# Patient Record
Sex: Female | Born: 1940
Health system: Southern US, Community
[De-identification: ages and names within clinical notes are randomized; demographics above are authoritative.]

## PROBLEM LIST (undated history)

## (undated) DIAGNOSIS — G600 Hereditary motor and sensory neuropathy: Secondary | ICD-10-CM

## (undated) DIAGNOSIS — L439 Lichen planus, unspecified: Secondary | ICD-10-CM

## (undated) DIAGNOSIS — D3140 Benign neoplasm of unspecified ciliary body: Secondary | ICD-10-CM

## (undated) DIAGNOSIS — I10 Essential (primary) hypertension: Secondary | ICD-10-CM

## (undated) HISTORY — PX: LEG SURGERY: SHX1003

## (undated) HISTORY — PX: ABDOMINAL HYSTERECTOMY: SHX81

## (undated) HISTORY — DX: Lichen planus, unspecified: L43.9

## (undated) HISTORY — DX: Hereditary motor and sensory neuropathy: G60.0

## (undated) HISTORY — PX: CHOLECYSTECTOMY: SHX55

## (undated) HISTORY — PX: CATARACT EXTRACTION: SUR2

## (undated) HISTORY — DX: Benign neoplasm of unspecified ciliary body: D31.40

## (undated) HISTORY — PX: APPENDECTOMY: SHX54

---

## 1997-07-30 ENCOUNTER — Ambulatory Visit (HOSPITAL_COMMUNITY): Admission: RE | Admit: 1997-07-30 | Discharge: 1997-07-30 | Payer: Self-pay | Admitting: Specialist

## 2011-05-16 ENCOUNTER — Emergency Department (HOSPITAL_BASED_OUTPATIENT_CLINIC_OR_DEPARTMENT_OTHER)
Admission: EM | Admit: 2011-05-16 | Discharge: 2011-05-17 | Disposition: A | Discharge status: Home / Self Care | Payer: Medicare Other | Attending: Emergency Medicine | Admitting: Emergency Medicine

## 2011-05-16 ENCOUNTER — Other Ambulatory Visit: Payer: Self-pay

## 2011-05-16 ENCOUNTER — Encounter (HOSPITAL_BASED_OUTPATIENT_CLINIC_OR_DEPARTMENT_OTHER): Payer: Self-pay | Admitting: *Deleted

## 2011-05-16 DIAGNOSIS — R1013 Epigastric pain: Secondary | ICD-10-CM | POA: Insufficient documentation

## 2011-05-16 DIAGNOSIS — N390 Urinary tract infection, site not specified: Secondary | ICD-10-CM | POA: Insufficient documentation

## 2011-05-16 DIAGNOSIS — I10 Essential (primary) hypertension: Secondary | ICD-10-CM | POA: Insufficient documentation

## 2011-05-16 HISTORY — DX: Essential (primary) hypertension: I10

## 2011-05-16 NOTE — ED Notes (Signed)
Pt states she noticed she was really tired this week and began to take her BP. Noticed it was high and became concerned. Called EMS and they came out to check her. BP 170/90. Denies s/s other than some mild neck pain tonight when pressure was "195/105" Denies pain at present.

## 2011-05-17 ENCOUNTER — Emergency Department (INDEPENDENT_AMBULATORY_CARE_PROVIDER_SITE_OTHER): Payer: Medicare Other

## 2011-05-17 DIAGNOSIS — I10 Essential (primary) hypertension: Secondary | ICD-10-CM

## 2011-05-17 DIAGNOSIS — M542 Cervicalgia: Secondary | ICD-10-CM

## 2011-05-17 DIAGNOSIS — R5383 Other fatigue: Secondary | ICD-10-CM

## 2011-05-17 DIAGNOSIS — R5381 Other malaise: Secondary | ICD-10-CM

## 2011-05-17 DIAGNOSIS — M898X9 Other specified disorders of bone, unspecified site: Secondary | ICD-10-CM

## 2011-05-17 LAB — COMPREHENSIVE METABOLIC PANEL
ALT: 22 U/L (ref 0–35)
AST: 23 U/L (ref 0–37)
Albumin: 4.3 g/dL (ref 3.5–5.2)
Alkaline Phosphatase: 70 U/L (ref 39–117)
Chloride: 104 mEq/L (ref 96–112)
Potassium: 4.5 mEq/L (ref 3.5–5.1)
Sodium: 142 mEq/L (ref 135–145)
Total Protein: 7 g/dL (ref 6.0–8.3)

## 2011-05-17 LAB — URINALYSIS, ROUTINE W REFLEX MICROSCOPIC
Bilirubin Urine: NEGATIVE
Glucose, UA: NEGATIVE mg/dL
Ketones, ur: NEGATIVE mg/dL
pH: 7 (ref 5.0–8.0)

## 2011-05-17 LAB — CBC
MCH: 31.5 pg (ref 26.0–34.0)
MCHC: 33.8 g/dL (ref 30.0–36.0)
Platelets: 206 10*3/uL (ref 150–400)
RBC: 4.26 MIL/uL (ref 3.87–5.11)

## 2011-05-17 LAB — DIFFERENTIAL
Basophils Relative: 0 % (ref 0–1)
Eosinophils Absolute: 0.2 10*3/uL (ref 0.0–0.7)
Neutro Abs: 4.6 10*3/uL (ref 1.7–7.7)
Neutrophils Relative %: 57 % (ref 43–77)

## 2011-05-17 LAB — CARDIAC PANEL(CRET KIN+CKTOT+MB+TROPI)
Relative Index: 4 — ABNORMAL HIGH (ref 0.0–2.5)
Troponin I: <0.3 ng/mL (ref <0.30)

## 2011-05-17 LAB — URINE MICROSCOPIC-ADD ON

## 2011-05-17 MED ORDER — CIPROFLOXACIN HCL 500 MG PO TABS: 500.0000 mg | ORAL_TABLET | Freq: Two times a day (BID) | ORAL | Status: AC

## 2011-05-17 MED ORDER — CIPROFLOXACIN HCL 500 MG PO TABS
500.0000 mg | ORAL_TABLET | Freq: Once | ORAL | Status: AC
  Administered 2011-05-17: 500 mg via ORAL
  Filled 2011-05-17: qty 1

## 2011-05-17 NOTE — ED Provider Notes (Addendum)
History     CSN: [id number]  Arrival date & time 05/16/11  2318   First MD Initiated Contact with Patient 05/16/11 2339      Chief Complaint  Patient presents with  . Hypertension    (Consider location/radiation/quality/duration/timing/severity/associated sxs/prior treatment) Patient is a 71 y.o. female presenting with hypertension. The history is provided by the patient.  Hypertension This is a chronic problem. The current episode started more than 1 week ago. The problem occurs constantly. The problem has been gradually worsening. Associated symptoms include abdominal pain. Pertinent negatives include no chest pain, no headaches and no shortness of breath. Associated symptoms comments: Unclear how long her stomach has been cramping but states she noticed at the most this evening. She denies any diarrhea. She has been checking her blood pressure every day this week and it has been elevated the highest was tonight at 195/105. Everyday this week she is taken 10 mg of lisinopril without improvement in the blood pressure. She states she's been sick he this week and this evening from 8-10 she had left-sided neck taking that resolve spontaneously. The symptoms are aggravated by nothing. The symptoms are relieved by nothing. She has tried nothing for the symptoms. The treatment provided no relief.    Past Medical History  Diagnosis Date  . Hypertension     Past Surgical History  Procedure Date  . Leg surgery   . Abdominal hysterectomy   . Cholecystectomy   . Cataract extraction   . Appendectomy     History reviewed. No pertinent family history.  History  Substance Use Topics  . Smoking status: Never Smoker   . Smokeless tobacco: Not on file  . Alcohol Use: No    OB History    Grav Para Term Preterm Abortions TAB SAB Ect Mult Living                  Review of Systems  Constitutional: Positive for fatigue. Negative for fever, chills and diaphoresis.  HENT: Positive for neck  pain.   Eyes: Negative for visual disturbance.  Respiratory: Negative for shortness of breath.   Cardiovascular: Negative for chest pain.  Gastrointestinal: Positive for abdominal pain. Negative for nausea, vomiting and diarrhea.        Abdominal cramping  Genitourinary: Negative for dysuria.  Musculoskeletal: Negative for back pain.  Neurological: Negative for dizziness, speech difficulty, weakness, numbness and headaches.  All other systems reviewed and are negative.    Allergies  Penicillins and Sulfa antibiotics  Home Medications   Current Outpatient Rx  Name Route Sig Dispense Refill  . DOXYCYCLINE HYCLATE 100 MG PO CAPS Oral Take 100 mg by mouth 2 (two) times daily.    Marland Kitchen LISINOPRIL 10 MG PO TABS Oral Take 10 mg by mouth daily.    . OXYBUTYNIN CHLORIDE 10 % TD GEL Transdermal Place onto the skin.    Marland Kitchen RISEDRONATE SODIUM 35 MG PO TBEC Oral Take by mouth.      BP 148/64  Pulse 70  Temp(Src) 97.7 F (36.5 C) (Oral)  Resp 18  Ht 5\' 3"  (1.6 m)  Wt 122 lb (55.339 kg)  BMI 21.61 kg/m2  SpO2 97%  Physical Exam  Nursing note and vitals reviewed. Constitutional: She is oriented to person, place, and time. She appears well-developed and well-nourished. No distress.  HENT:  Head: Normocephalic and atraumatic.  Mouth/Throat: Oropharynx is clear and moist.  Eyes: EOM are normal. Pupils are equal, round, and reactive to light.  Neck: Normal  range of motion. Neck supple. No JVD present. Carotid bruit is not present.  Cardiovascular: Normal rate, regular rhythm, normal heart sounds and intact distal pulses.  Exam reveals no friction rub.   No murmur heard. Pulmonary/Chest: Effort normal and breath sounds normal. She has no wheezes. She has no rales.  Abdominal: Soft. Bowel sounds are normal. She exhibits no distension. There is no tenderness. There is no rebound and no guarding.  Musculoskeletal: Normal range of motion. She exhibits no tenderness.       No edema  Neurological:  She is alert and oriented to person, place, and time. No cranial nerve deficit.  Skin: Skin is warm and dry. No rash noted.  Psychiatric: She has a normal mood and affect. Her behavior is normal.    ED Course  Procedures (including critical care time)  Labs Reviewed  COMPREHENSIVE METABOLIC PANEL - Abnormal; Notable for the following:    Glucose, Bld 101 (*)    Creatinine, Ser 0.30 (*)    Total Bilirubin 0.2 (*)    All other components within normal limits  URINALYSIS, ROUTINE W REFLEX MICROSCOPIC - Abnormal; Notable for the following:    APPearance CLOUDY (*)    Hgb urine dipstick MODERATE (*)    Leukocytes, UA MODERATE (*)    All other components within normal limits  CARDIAC PANEL(CRET KIN+CKTOT+MB+TROPI) - Abnormal; Notable for the following:    CK, MB 4.7 (*)    Relative Index 4.0 (*)    All other components within normal limits  URINE MICROSCOPIC-ADD ON - Abnormal; Notable for the following:    Bacteria, UA FEW (*)    Casts HYALINE CASTS (*)    All other components within normal limits  CBC  DIFFERENTIAL   Dg Chest 2 View  05/17/2011  *RADIOLOGY REPORT*  Clinical Data: Fatigue.  Hypertension.  Neck pain.  CHEST - 2 VIEW  Comparison: None.  Findings: Cardiac and mediastinal contours appear normal.  The lungs appear clear.  No pleural effusion is identified.  Mild spurring of the left proximal humeral head noted.  IMPRESSION:  1.  No acute thoracic findings. 2.  Mild spurring of the left proximal humerus.  Original Report Authenticated By: Dellia Cloud, M.D.     Date: 05/17/2011  Rate: 68  Rhythm: normal sinus rhythm  QRS Axis: normal  Intervals: normal  ST/T Wave abnormalities: normal  Conduction Disutrbances:none  Narrative Interpretation:   Old EKG Reviewed: none available   1. UTI (lower urinary tract infection)       MDM   Patient states she has not been feeling well all week with generalized fatigue and hypertension. She states this evening her  blood pressure was elevated at 195/105 and had some mild left-sided neck pain that did not radiate, no shortness of breath, no chest pain. The pain lasted for 2 hours and resolve spontaneously. This occurred 4 hours ago. Patient denies other symptoms but on exam does have some mild epigastric pain. Unlikely to be cardiac in origin they sound patient's symptoms however we'll check cardiac enzymes, CBC, CMP, UA to rule out infection or cardiac cause for her symptoms. EKG within normal limits. Chest x-ray pending.  2:11 AM All tests normal except for UA which shows a urinary tract infection. Results were discussed with patient. She'll start Cipro do to penicillin sulfa allergy. Urine culture sent. She'll followup with her doctor Monday as planned.      Gwyneth Sprout, MD 05/17/11 4098  Gwyneth Sprout, MD 05/17/11 [phone]

## 2011-05-17 NOTE — ED Notes (Signed)
MD at bedside. 

## 2011-05-17 NOTE — ED Notes (Addendum)
Requested a copy of visit today(medical records) to be taken to an appointment this week. The copy given to patient by secretary.

## 2011-05-17 NOTE — Discharge Instructions (Signed)
Urine Culture Collection, Female  You will collect a sample of pee (urine) in a cup. Read the instructions below before beginning. If you have any questions, ask the nurse before you begin. Follow the instructions carefully. 1. Wash your hands with soap and water and dry them thoroughly.  2. Open the lid of the cup. Be careful not to touch the inside.  3. Clean the private (genital) area. 1. Sit over the toilet. Use the fingers of one hand to separate and hold open the folds of the skin in your private area.  2. Clean the pee (urinary) opening and surrounding area with the gauze, wiping from front to back. Throw away the gauze in the trash, not the toilet.  3. Repeat step "b"2 more times.  4. With the folds of skin still separated, pee a small amount into toilet. STOP, then pee into the cup. Fill the cup half way.  5. Put the lid on the cup tightly.  6. Wash your hands with soap and water.  7. If you were given a label, put the label on the cup.  8. Give the cup to the nurse.  Document Released: 01/09/2008 Document Revised: 01/15/2011 Document Reviewed: 01/09/2008 [employer] Patient Information 2012 [employer].Urinary Tract Infection Infections of the urinary tract can start in several places. A bladder infection (cystitis), a kidney infection (pyelonephritis), and a prostate infection (prostatitis) are different types of urinary tract infections (UTIs). They usually get better if treated with medicines (antibiotics) that kill germs. Take all the medicine until it is gone. You or your child may feel better in a few days, but TAKE ALL MEDICINE or the infection may not respond and may become more difficult to treat. HOME CARE INSTRUCTIONS   Drink enough water and fluids to keep the urine clear or pale yellow. Cranberry juice is especially recommended, in addition to large amounts of water.   Avoid caffeine, tea, and carbonated beverages. They tend to irritate the bladder.   Alcohol may  irritate the prostate.   Only take over-the-counter or prescription medicines for pain, discomfort, or fever as directed by your caregiver.  To prevent further infections:  Empty the bladder often. Avoid holding urine for long periods of time.   After a bowel movement, women should cleanse from front to back. Use each tissue only once.   Empty the bladder before and after sexual intercourse.  FINDING OUT THE RESULTS OF YOUR TEST Not all test results are available during your visit. If your or your child's test results are not back during the visit, make an appointment with your caregiver to find out the results. Do not assume everything is normal if you have not heard from your caregiver or the medical facility. It is important for you to follow up on all test results. SEEK MEDICAL CARE IF:   There is back pain.   Your baby is older than 3 months with a rectal temperature of 100.5 F (38.1 C) or higher for more than 1 day.   Your or your child's problems (symptoms) are no better in 3 days. Return sooner if you or your child is getting worse.  SEEK IMMEDIATE MEDICAL CARE IF:   There is severe back pain or lower abdominal pain.   You or your child develops chills.   You have a fever.   Your baby is older than 3 months with a rectal temperature of 102 F (38.9 C) or higher.   Your baby is 3 months old or   younger with a rectal temperature of 100.4 F (38 C) or higher.   There is nausea or vomiting.   There is continued burning or discomfort with urination.  MAKE SURE YOU:   Understand these instructions.   Will watch your condition.   Will get help right away if you are not doing well or get worse.  Document Released: 11/05/2004 Document Revised: 01/15/2011 Document Reviewed: 06/10/2006 [employer] Patient Information 2012 [employer]. 

## 2011-05-17 NOTE — ED Notes (Signed)
rx x 1 given for cipro- d/c home with a ride

## 2011-05-18 LAB — URINE CULTURE: Colony Count: 3000

## 2014-04-22 ENCOUNTER — Emergency Department (HOSPITAL_BASED_OUTPATIENT_CLINIC_OR_DEPARTMENT_OTHER): Payer: Medicare Other

## 2014-04-22 ENCOUNTER — Encounter (HOSPITAL_BASED_OUTPATIENT_CLINIC_OR_DEPARTMENT_OTHER): Payer: Self-pay | Admitting: *Deleted

## 2014-04-22 ENCOUNTER — Emergency Department (HOSPITAL_BASED_OUTPATIENT_CLINIC_OR_DEPARTMENT_OTHER)
Admission: EM | Admit: 2014-04-22 | Discharge: 2014-04-22 | Disposition: A | Discharge status: Home / Self Care | Payer: Medicare Other | Attending: Emergency Medicine | Admitting: Emergency Medicine

## 2014-04-22 DIAGNOSIS — Z880 Allergy status to penicillin: Secondary | ICD-10-CM | POA: Diagnosis not present

## 2014-04-22 DIAGNOSIS — Y92010 Kitchen of single-family (private) house as the place of occurrence of the external cause: Secondary | ICD-10-CM | POA: Diagnosis not present

## 2014-04-22 DIAGNOSIS — S8992XA Unspecified injury of left lower leg, initial encounter: Secondary | ICD-10-CM | POA: Diagnosis present

## 2014-04-22 DIAGNOSIS — Z792 Long term (current) use of antibiotics: Secondary | ICD-10-CM | POA: Diagnosis not present

## 2014-04-22 DIAGNOSIS — Y9389 Activity, other specified: Secondary | ICD-10-CM | POA: Diagnosis not present

## 2014-04-22 DIAGNOSIS — I10 Essential (primary) hypertension: Secondary | ICD-10-CM | POA: Insufficient documentation

## 2014-04-22 DIAGNOSIS — W1839XA Other fall on same level, initial encounter: Secondary | ICD-10-CM | POA: Diagnosis not present

## 2014-04-22 DIAGNOSIS — Y998 Other external cause status: Secondary | ICD-10-CM | POA: Insufficient documentation

## 2014-04-22 DIAGNOSIS — S8002XA Contusion of left knee, initial encounter: Secondary | ICD-10-CM | POA: Diagnosis not present

## 2014-04-22 DIAGNOSIS — W19XXXA Unspecified fall, initial encounter: Secondary | ICD-10-CM

## 2014-04-22 DIAGNOSIS — M25462 Effusion, left knee: Secondary | ICD-10-CM

## 2014-04-22 MED ORDER — HYDROCODONE-ACETAMINOPHEN 5-325 MG PO TABS: 1.0000 | ORAL_TABLET | ORAL | Status: DC | PRN

## 2014-04-22 MED ORDER — HYDROCODONE-ACETAMINOPHEN 5-325 MG PO TABS
1.0000 | ORAL_TABLET | Freq: Once | ORAL | Status: AC
  Administered 2014-04-22: 1 via ORAL
  Filled 2014-04-22: qty 1

## 2014-04-22 NOTE — ED Notes (Signed)
Fell last night onto both knees. Now c/o swelling and pain in both, worse in left knee.

## 2014-04-22 NOTE — ED Provider Notes (Signed)
CSN: [id number]     Arrival date & time 04/22/14  1311 History   First MD Initiated Contact with Patient 04/22/14 1357     Chief Complaint  Patient presents with  . Knee Pain  . Fall      HPI  Patient presents for evaluation of knee pain. States she caught the tip of her foot in her kitchen and fell 4 to onto both knees. Had some discomfort. She was able to get up. She had some pain in both knees. Today her left knee is swollen and more painful and she presents here. Still able ambulate, although walking with a limp. No twisting. Does not take blood thinners.  Past Medical History  Diagnosis Date  . Hypertension    Past Surgical History  Procedure Laterality Date  . Leg surgery    . Abdominal hysterectomy    . Cholecystectomy    . Cataract extraction    . Appendectomy     No family history on file. History  Substance Use Topics  . Smoking status: Never Smoker   . Smokeless tobacco: Not on file  . Alcohol Use: No   OB History    No data available     Review of Systems  Constitutional: Negative for fever, chills, diaphoresis, appetite change and fatigue.  HENT: Negative for mouth sores, sore throat and trouble swallowing.   Eyes: Negative for visual disturbance.  Respiratory: Negative for cough, chest tightness, shortness of breath and wheezing.   Cardiovascular: Negative for chest pain.  Gastrointestinal: Negative for nausea, vomiting, abdominal pain, diarrhea and abdominal distention.  Endocrine: Negative for polydipsia, polyphagia and polyuria.  Genitourinary: Negative for dysuria, frequency and hematuria.  Musculoskeletal: Positive for joint swelling and arthralgias. Negative for gait problem.       Pain and swelling to the left knee. Denies pain to the right knee today.  Skin: Negative for color change, pallor and rash.  Neurological: Negative for dizziness, syncope, light-headedness and headaches.  Hematological: Does not bruise/bleed easily.    Psychiatric/Behavioral: Negative for behavioral problems and confusion.      Allergies  Penicillins and Sulfa antibiotics  Home Medications   Prior to Admission medications   Medication Sig Start Date End Date Taking? Authorizing Provider  denosumab (PROLIA) 60 MG/ML SOLN injection Inject 60 mg into the skin every 6 (six) months. Administer in upper arm, thigh, or abdomen   Yes Historical Provider, MD  mirabegron ER (MYRBETRIQ) 25 MG TB24 tablet Take 25 mg by mouth daily.   Yes Historical Provider, MD  omeprazole (PRILOSEC) 20 MG capsule Take 20 mg by mouth daily.   Yes Historical Provider, MD  doxycycline (VIBRAMYCIN) 100 MG capsule Take 100 mg by mouth 2 (two) times daily.    Historical Provider, MD  HYDROcodone-acetaminophen (NORCO/VICODIN) 5-325 MG per tablet Take 1 tablet by mouth every 4 (four) hours as needed. 04/22/14   Tanna Furry, MD  lisinopril (PRINIVIL,ZESTRIL) 10 MG tablet Take 10 mg by mouth daily.    Historical Provider, MD  Oxybutynin Chloride (GELNIQUE) 10 % GEL Place onto the skin.    Historical Provider, MD  Risedronate Sodium (ATELVIA) 35 MG TBEC Take by mouth.    Historical Provider, MD   BP 151/58 mmHg  Pulse 67  Temp(Src) 98.2 F (36.8 C) (Oral)  Resp 18  Ht 5\' 3"  (1.6 m)  Wt 114 lb (51.71 kg)  BMI 20.20 kg/m2  SpO2 100% Physical Exam  Constitutional: She is oriented to person, place, and time. She  appears well-developed and well-nourished. No distress.  HENT:  Head: Normocephalic.  Eyes: Conjunctivae are normal. Pupils are equal, round, and reactive to light. No scleral icterus.  Neck: Normal range of motion. Neck supple. No thyromegaly present.  Cardiovascular: Normal rate and regular rhythm.  Exam reveals no gallop and no friction rub.   No murmur heard. Pulmonary/Chest: Effort normal and breath sounds normal. No respiratory distress. She has no wheezes. She has no rales.  Abdominal: Soft. Bowel sounds are normal. She exhibits no distension. There is  no tenderness. There is no rebound.  Musculoskeletal: Normal range of motion.       Legs: Neurological: She is alert and oriented to person, place, and time.  Skin: Skin is warm and dry. No rash noted.  Psychiatric: She has a normal mood and affect. Her behavior is normal.    ED Course  Procedures (including critical care time) Labs Review Labs Reviewed - No data to display  Imaging Review Dg Knee Complete 4 Views Left  04/22/2014   CLINICAL DATA:  Fall.  EXAM: LEFT KNEE - COMPLETE 4+ VIEW  COMPARISON:  None.  FINDINGS: Large suprapatellar joint effusion. Medial compartment narrowing, sharpening of the tibial spines and marginal spur formation noted. There is no evidence of fracture, dislocation, or joint effusion. Old healed fracture involving the proximal fibula noted. There is no evidence of arthropathy or other focal bone abnormality. Soft tissues are unremarkable.  IMPRESSION: 1. Joint effusion. 2. Mild osteoarthritis.   Electronically Signed   By: Kerby Moors M.D.   On: 04/22/2014 14:26     EKG Interpretation None      MDM   Final diagnoses:  Fall  Knee contusion, left, initial encounter  Knee effusion, left    Exam and x-ray show effusion. X-ray show no fracture. Difficult to perform ligament testing as patient is quite uncomfortable. Not a tense effusion. She does exhibit range of motion. With the mechanism of falling onto a flexed knee and it is unlikely she is sustained ligament injury. Plan is home, intermittent ice, minimize weightbearing, Ace wrap applied here. Vicodin for pain. Primary care, or her orthopedist in 7-10 days with any continued pain or swelling.    Tanna Furry, MD 04/22/14 1447

## 2014-04-22 NOTE — Discharge Instructions (Signed)
Minimize your weightbearing on the leg until the pain, and swelling are improving. Ace wrap on the leg until his pain and swelling are improving. Ice for 20 minutes at a time, 3-4 times per day. Vicodin for pain. Recheck with your primary care physician, or orthopedists in 7-10 days of pain and swelling are not resolved  Contusion A contusion is a deep bruise. Contusions are the result of an injury that caused bleeding under the skin. The contusion may turn blue, purple, or yellow. Minor injuries will give you a painless contusion, but more severe contusions may stay painful and swollen for a few weeks.  CAUSES  A contusion is usually caused by a blow, trauma, or direct force to an area of the body. SYMPTOMS   Swelling and redness of the injured area.  Bruising of the injured area.  Tenderness and soreness of the injured area.  Pain. DIAGNOSIS  The diagnosis can be made by taking a history and physical exam. An X-ray, CT scan, or MRI may be needed to determine if there were any associated injuries, such as fractures. TREATMENT  Specific treatment will depend on what area of the body was injured. In general, the best treatment for a contusion is resting, icing, elevating, and applying cold compresses to the injured area. Over-the-counter medicines may also be recommended for pain control. Ask your caregiver what the best treatment is for your contusion. HOME CARE INSTRUCTIONS   Put ice on the injured area.  Put ice in a plastic bag.  Place a towel between your skin and the bag.  Leave the ice on for 15-20 minutes, 3-4 times a day, or as directed by your health care provider.  Only take over-the-counter or prescription medicines for pain, discomfort, or fever as directed by your caregiver. Your caregiver may recommend avoiding anti-inflammatory medicines (aspirin, ibuprofen, and naproxen) for 48 hours because these medicines may increase bruising.  Rest the injured area.  If  possible, elevate the injured area to reduce swelling. SEEK IMMEDIATE MEDICAL CARE IF:   You have increased bruising or swelling.  You have pain that is getting worse.  Your swelling or pain is not relieved with medicines. MAKE SURE YOU:   Understand these instructions.  Will watch your condition.  Will get help right away if you are not doing well or get worse. Document Released: 11/05/2004 Document Revised: 01/31/2013 Document Reviewed: 12/01/2010 [facility] Patient Information 2015 [place], [place]. This information is not intended to replace advice given to you by your health care provider. Make sure you discuss any questions you have with your health care provider.  Knee Effusion The medical term for having fluid in your knee is effusion. This is often due to an internal derangement of the knee. This means something is wrong inside the knee. Some of the causes of fluid in the knee may be torn cartilage, a torn ligament, or bleeding into the joint from an injury. Your knee is likely more difficult to bend and move. This is often because there is increased pain and pressure in the joint. The time it takes for recovery from a knee effusion depends on different factors, including:   Type of injury.  Your age.  Physical and medical conditions.  Rehabilitation Strategies. How long you will be away from your normal activities will depend on what kind of knee problem you have and how much damage is present. Your knee has two types of cartilage. Articular cartilage covers the bone ends and lets your knee  bend and move smoothly. Two menisci, thick pads of cartilage that form a rim inside the joint, help absorb shock and stabilize your knee. Ligaments bind the bones together and support your knee joint. Muscles move the joint, help support your knee, and take stress off the joint itself. CAUSES  Often an effusion in the knee is caused by an injury to one of the menisci. This is often a tear in  the cartilage. Recovery after a meniscus injury depends on how much meniscus is damaged and whether you have damaged other knee tissue. Small tears may heal on their own with conservative treatment. Conservative means rest, limited weight bearing activity and muscle strengthening exercises. Your recovery may take up to 6 weeks.  TREATMENT  Larger tears may require surgery. Meniscus injuries may be treated during arthroscopy. Arthroscopy is a procedure in which your surgeon uses a small telescope like instrument to look in your knee. Your caregiver can make a more accurate diagnosis (learning what is wrong) by performing an arthroscopic procedure. If your injury is on the inner margin of the meniscus, your surgeon may trim the meniscus back to a smooth rim. In other cases your surgeon will try to repair a damaged meniscus with stitches (sutures). This may make rehabilitation take longer, but may provide better long term result by helping your knee keep its shock absorption capabilities. Ligaments which are completely torn usually require surgery for repair. HOME CARE INSTRUCTIONS  Use crutches as instructed.  If a brace is applied, use as directed.  Once you are home, an ice pack applied to your swollen knee may help with discomfort and help decrease swelling.  Keep your knee raised (elevated) when you are not up and around or on crutches.  Only take over-the-counter or prescription medicines for pain, discomfort, or fever as directed by your caregiver.  Your caregivers will help with instructions for rehabilitation of your knee. This often includes strengthening exercises.  You may resume a normal diet and activities as directed. SEEK MEDICAL CARE IF:   There is increased swelling in your knee.  You notice redness, swelling, or increasing pain in your knee.  An unexplained oral temperature above 102 F (38.9 C) develops. SEEK IMMEDIATE MEDICAL CARE IF:   You develop a rash.  You have  difficulty breathing.  You have any allergic reactions from medications you may have been given.  There is severe pain with any motion of the knee. MAKE SURE YOU:   Understand these instructions.  Will watch your condition.  Will get help right away if you are not doing well or get worse. Document Released: 04/18/2003 Document Revised: 04/20/2011 Document Reviewed: 06/22/2007 [facility] Patient Information 2015 [place], [place]. This information is not intended to replace advice given to you by your health care provider. Make sure you discuss any questions you have with your health care provider.

## 2015-10-17 ENCOUNTER — Other Ambulatory Visit: Payer: Self-pay | Admitting: *Deleted

## 2015-10-17 ENCOUNTER — Encounter: Payer: Self-pay | Admitting: *Deleted

## 2015-10-18 ENCOUNTER — Encounter: Payer: Self-pay | Admitting: Diagnostic Neuroimaging

## 2015-10-18 ENCOUNTER — Ambulatory Visit (INDEPENDENT_AMBULATORY_CARE_PROVIDER_SITE_OTHER): Payer: Medicare HMO | Admitting: Diagnostic Neuroimaging

## 2015-10-18 ENCOUNTER — Telehealth: Payer: Self-pay | Admitting: Diagnostic Neuroimaging

## 2015-10-18 VITALS — BP 140/74 | HR 81 | Ht 63.0 in | Wt 115.2 lb

## 2015-10-18 DIAGNOSIS — G609 Hereditary and idiopathic neuropathy, unspecified: Secondary | ICD-10-CM | POA: Diagnosis not present

## 2015-10-18 NOTE — Telephone Encounter (Signed)
Patient called 9:57:17 to advise, emergency came up this morning and she is running late for 10:30 New Patient appointment w/Dr. Leta Baptist this morning, will be here in approximately 30 minutes. Patient advised, will be seen up to 10 minutes past appointment time, after that will need to reschedule.

## 2015-10-18 NOTE — Patient Instructions (Signed)
  Thank you for coming to see [place] at [facility]. I hope we have been able to provide you high quality care today.  You may receive a patient satisfaction survey over the next few weeks. We would appreciate your feedback and comments so that we may continue to improve ourselves and the health of our patients.    ~~~~~~~~~~~~~~~~~~~~~~~~~~~~~~~~~~~~~~~~~~~~~~~~~~~~~~~~~~~~~~~~~  DR. PENUMALLI'S GUIDE TO HAPPY AND HEALTHY LIVING These are some of my general health and wellness recommendations. Some of them may apply to you better than others. Please use common sense as you try these suggestions and feel free to ask me any questions.   ACTIVITY/FITNESS Mental, social, emotional and physical stimulation are very important for brain and body health. Try learning a new activity (arts, music, language, sports, games).  Keep moving your body to the best of your abilities. You can do this at home, inside or outside, the park, community center, gym or anywhere you like. Consider a physical therapist or personal trainer to get started. Consider the app Sworkit. Fitness trackers such as smart-watches, smart-phones or Fitbits can help as well.   NUTRITION Eat more plants: colorful vegetables, nuts, seeds and berries.  Eat less sugar, salt, preservatives and processed foods.  Avoid toxins such as cigarettes and alcohol.  Drink water when you are thirsty. Warm water with a slice of lemon is an excellent morning drink to start the day.  Consider these websites for more information The Nutrition Source (https://www.henry-hernandez.biz/) Precision Nutrition (WindowBlog.ch)   RELAXATION Consider practicing mindfulness meditation or other relaxation techniques such as deep breathing, prayer, yoga, tai chi, massage. See website mindful.org or the apps Headspace or Calm to help get started.   SLEEP Try to get at least 7-8+ hours sleep per  day. Regular exercise and reduced caffeine will help you sleep better. Practice good sleep hygeine techniques. See website sleep.org for more information.   PLANNING Prepare estate planning, living will, healthcare POA documents. Sometimes this is best planned with the help of an attorney. Theconversationproject.org and agingwithdignity.org are excellent resources.

## 2015-10-18 NOTE — Progress Notes (Signed)
[facility]  PATIENT: Nina Rush DOB: 07/25/1940  REFERRING CLINICIAN: Kathlen Mody HISTORY FROM: patient and husband  REASON FOR VISIT: new consult    HISTORICAL  CHIEF COMPLAINT:  Chief Complaint  Patient presents with  . Other    rm 7, New pt, Charcot-Marie-Tooth disease, husband- Tonye Royalty, "an evaluation of my condition and how it can improve"    HISTORY OF PRESENT ILLNESS:   75 year old right-handed female here for evaluation of Charcot-Marie-Tooth neuropathy. At age 31 months old patient had onset of walking difficulty. Patient continued to have progressive lower extremity numbness and weakness throughout her life. She went through extensive evaluations with neurology, neuromuscular clinics, at [facility], [place] and [facility]. 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 Patient has progressively declined over many years. Now she uses a Rollator walker. She continues to have weakness in her lower extremities greater than upper studies. She has had atrophy in her hands and lower extremity muscles. Patient has high arches and hammertoes. Patient also has numbness and tingling in the bottoms of her feet.  Patient presents today to find out if any new treatments have been discovered regarding treatment of this hereditary neuropathy.  Patient last seen at [facility] neuromuscular clinic in 2012.   REVIEW OF SYSTEMS: Full 14 system review of systems performed and negative with exception of: Only as per history of present illness.  ALLERGIES: Allergies  Allergen Reactions  . Penicillins Rash  . Sulfa Antibiotics Rash    HOME MEDICATIONS: Outpatient Medications Prior to Visit  Medication Sig Dispense Refill  . lisinopril (PRINIVIL,ZESTRIL) 10 MG tablet Take 10 mg by mouth daily.    . mirabegron ER (MYRBETRIQ) 25 MG TB24 tablet Take 25 mg by mouth  daily.    . vitamin C (ASCORBIC ACID) 500 MG tablet 500 mg.    . denosumab (PROLIA) 60 MG/ML SOLN injection Inject 60 mg into the skin every 6 (six) months. Administer in upper arm, thigh, or abdomen    . DHA-EPA-VITAMIN E PO Take by mouth.    . doxycycline (VIBRAMYCIN) 100 MG capsule Take 100 mg by mouth 2 (two) times daily.    Marland Kitchen HYDROcodone-acetaminophen (NORCO/VICODIN) 5-325 MG per tablet Take 1 tablet by mouth every 4 (four) hours as needed. 24 tablet 0  . omeprazole (PRILOSEC) 20 MG capsule Take 20 mg by mouth daily.    . Oxybutynin Chloride (GELNIQUE) 10 % GEL Place onto the skin.    . Risedronate Sodium (ATELVIA) 35 MG TBEC Take by mouth.     No facility-administered medications prior to visit.     PAST MEDICAL HISTORY: Past Medical History:  Diagnosis Date  . Charcot-Marie-Tooth disease   . Fuchs' adenoma    bilateral eyes  . Hypertension   . Lichen planus    scalp    PAST SURGICAL HISTORY: Past Surgical History:  Procedure Laterality Date  . ABDOMINAL HYSTERECTOMY     total  . APPENDECTOMY    . CATARACT EXTRACTION Bilateral   . CHOLECYSTECTOMY    . LEG SURGERY     as child    FAMILY HISTORY: Family History  Problem Relation Age of Onset  . Cancer - Lung Mother   . Stroke Father     SOCIAL HISTORY:  Social History   Social History  . Marital status: Married    Spouse name: Tonye Royalty  . Number of children: 2  . Years of education: 37  Occupational History  . Not on file.   Social History Main Topics  . Smoking status: Never Smoker  . Smokeless tobacco: Never Used  . Alcohol use No  . Drug use: No  . Sexual activity: Not on file   Other Topics Concern  . Not on file   Social History Narrative  . No narrative on file     PHYSICAL EXAM  GENERAL EXAM/CONSTITUTIONAL: Vitals:  Vitals:   10/18/15 1057  BP: 140/74  Pulse: 81  Weight: 115 lb 3.2 oz (52.3 kg)  Height: 5\' 3"  (1.6 m)     Body mass index is 20.41 kg/m.  Visual Acuity  Screening   Right eye Left eye Both eyes  Without correction:     With correction: 20/50 20/50      Patient is in no distress; well developed, nourished and groomed; neck is supple  CARDIOVASCULAR:  Examination of carotid arteries is normal; no carotid bruits  Regular rate and rhythm, no murmurs  Examination of peripheral vascular system by observation and palpation is normal  EYES:  Ophthalmoscopic exam of optic discs and posterior segments is normal; no papilledema or hemorrhages  MUSCULOSKELETAL:  Gait, strength, tone, movements noted in Neurologic exam below  NEUROLOGIC: MENTAL STATUS:  No flowsheet data found.  awake, alert, oriented to person, place and time  recent and remote memory intact  normal attention and concentration  language fluent, comprehension intact, naming intact,   fund of knowledge appropriate  CRANIAL NERVE:   2nd - no papilledema on fundoscopic exam  2nd, 3rd, 4th, 6th - pupils equal and reactive to light, visual fields full to confrontation, extraocular muscles intact, no nystagmus  5th - facial sensation symmetric  7th - facial strength symmetric  8th - hearing intact  9th - palate elevates symmetrically, uvula midline  11th - shoulder shrug symmetric  12th - tongue protrusion midline  MOTOR:   ATROPHY AND WEAKNESS IN HANDS  BILATERAL HF 3; BILATERAL DF 2 (LEFT WORSE THAN RIGHT)  normal bulk and tone, full strength in the BUE, BLE  SENSORY:   normal and symmetric to light touch, temperature, vibration; DECR IN FEET/ANKLES; ABSENT IN RIGHT FOOT  COORDINATION:   finger-nose-finger, fine finger movements normal  REFLEXES:   deep tendon reflexes --> TRACE IN BILATERAL UPPER EXT; 1 AT KNEES; ABSENT AT ANKLES  GAIT/STATION:   narrow based gait; USES A ROLLATOR     DIAGNOSTIC DATA (LABS, IMAGING, TESTING) - I reviewed patient records, labs, notes, testing and imaging myself where available.  Lab Results    Component Value Date   WBC 8.1 05/17/2011   HGB 13.4 05/17/2011   HCT 39.6 05/17/2011   MCV 93.0 05/17/2011   PLT 206 05/17/2011      Component Value Date/Time   NA 142 05/17/2011 0055   K 4.5 05/17/2011 0055   CL 104 05/17/2011 0055   CO2 30 05/17/2011 0055   GLUCOSE 101 (H) 05/17/2011 0055   BUN 10 05/17/2011 0055   CREATININE 0.30 (L) 05/17/2011 0055   CALCIUM 10.1 05/17/2011 0055   PROT 7.0 05/17/2011 0055   ALBUMIN 4.3 05/17/2011 0055   AST 23 05/17/2011 0055   ALT 22 05/17/2011 0055   ALKPHOS 70 05/17/2011 0055   BILITOT 0.2 (L) 05/17/2011 0055   GFRNONAA >90 05/17/2011 0055   GFRAA >90 05/17/2011 0055   No results found for: CHOL, HDL, LDLCALC, LDLDIRECT, TRIG, CHOLHDL No results found for: HGBA1C No results found for: VITAMINB12 No results found  for: TSH   04/13/05 EMG/NCS - Moderate to severe length dependent polyneuropathy     ASSESSMENT AND PLAN  75 y.o. year old female here with lifelong progressive neuropathy syndrome affecting lower greater than upper extremities. Patient has had extensive testing and evaluation in the past at multiple academic neuromuscular centers. I reviewed outside records and test results. I agree with patient's current diagnosis of probable hereditary peripheral neuropathy. 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 We discussed the importance of nutrition, physical activity, use of assisted device as compensatory strategies for this condition. Unfortunately no new breakthrough therapies have been made available at this time.     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 Hereditary and idiopathic peripheral neuropathy     PLAN: - continue supportive care  - stay active with physical activity  Return if symptoms worsen or fail to improve.    Penni Bombard, MD [id number], [id number] AM Certified  in Neurology, Neurophysiology and Neuroimaging  [facility] [address] [place], [place] [address] [phone]

## 2015-10-18 NOTE — Telephone Encounter (Signed)
Noted  

## 2015-12-04 ENCOUNTER — Telehealth: Payer: Self-pay | Admitting: Diagnostic Neuroimaging

## 2015-12-04 DIAGNOSIS — G609 Hereditary and idiopathic neuropathy, unspecified: Secondary | ICD-10-CM

## 2015-12-04 NOTE — Telephone Encounter (Signed)
Pt called request RX for foot or leg brace-UFO she said.  She said she has an old foot brace but she feels she needs a new one.

## 2015-12-05 NOTE — Telephone Encounter (Signed)
Called patient and informed her that a referral to neuro rehab PT has been placed. She should get a call within a few days to schedule an appointment. Patient stated she was told to get her neurologist to order the brace. Advised her that the PT's role is to evaluate her foot and the specific type brace she needs. Advised she call back next Tues if she has not heard about scheduling an appointment. She verbalized understanding, appreciation.

## 2015-12-05 NOTE — Telephone Encounter (Signed)
Ok to place referral from here for PT and foot brace evaluation/replacement. -VRP

## 2015-12-05 NOTE — Addendum Note (Signed)
Addended by: Minna Antis on: 12/05/2015 10:49 AM   Modules accepted: Orders

## 2015-12-05 NOTE — Telephone Encounter (Signed)
PT eval placed for patient as she requested.

## 2016-01-09 ENCOUNTER — Ambulatory Visit: Payer: Medicare HMO | Attending: Diagnostic Neuroimaging | Admitting: Rehabilitation

## 2016-01-09 ENCOUNTER — Encounter: Payer: Self-pay | Admitting: Rehabilitation

## 2016-01-09 DIAGNOSIS — M6281 Muscle weakness (generalized): Secondary | ICD-10-CM

## 2016-01-09 DIAGNOSIS — R2681 Unsteadiness on feet: Secondary | ICD-10-CM | POA: Diagnosis present

## 2016-01-09 DIAGNOSIS — R2689 Other abnormalities of gait and mobility: Secondary | ICD-10-CM | POA: Diagnosis present

## 2016-01-09 DIAGNOSIS — R208 Other disturbances of skin sensation: Secondary | ICD-10-CM

## 2016-01-09 NOTE — Therapy (Signed)
[facility] [address] [place], [place], [address] Phone: [phone]   Fax:  [phone]  Physical Therapy Evaluation  Patient Details  Name: Nina Rush MRN: [id number] Date of Birth: 10-31-1940 Referring Provider: Andrey Spearman  Encounter Date: 01/09/2016      PT End of Session - 01/09/16 1932    Visit Number 1   Number of Visits 11   Date for PT Re-Evaluation 03/09/16   Authorization Type MCR- Gcode on every 10th visit   PT Start Time 1445   PT Stop Time 1533   PT Time Calculation (min) 48 min      Past Medical History:  Diagnosis Date  . Charcot-Marie-Tooth disease   . Fuchs' adenoma    bilateral eyes  . Hypertension   . Lichen planus    scalp    Past Surgical History:  Procedure Laterality Date  . ABDOMINAL HYSTERECTOMY     total  . APPENDECTOMY    . CATARACT EXTRACTION Bilateral   . CHOLECYSTECTOMY    . LEG SURGERY     as child    There were no vitals filed for this visit.       Subjective Assessment - 01/09/16 1450    Subjective "I want an evaluation today because I feel that I'm getting weaker.  I don't know if the braces I have are the right ones.  I'm not falling as much."   Patient is accompained by: Family member  Tonye Royalty (husband)   Limitations House hold activities;Walking   Patient Stated Goals "I want to make sure I have the right kind of support for my feet since I have foot drop."     Currently in Pain? Yes  not today, but does have pain in feet intermittently            [facility] PT Assessment - 01/09/16 0001      Assessment   Medical Diagnosis Charcot marie tooth, peripheral neuropathy   Referring Provider Penumalli, Vikram   Onset Date/Surgical Date --  over several years   Prior Therapy has had PT in the past     Precautions   Precautions Fall   Precaution Comments has B foot up braces     Restrictions   Weight Bearing Restrictions No     Balance Screen    Has the patient fallen in the past 6 months Yes   How many times? 1-2   Has the patient had a decrease in activity level because of a fear of falling?  Yes   Is the patient reluctant to leave their home because of a fear of falling?  Yes     [employer] Private residence   Living Arrangements Spouse/significant other   Available Help at Discharge Family;Available PRN/intermittently   Type of Home House   Home Access Level entry   Home Layout One level  bonus room upstairs (14 stairs, R rail)   Home Equipment Walker - 4 wheels;Grab bars - tub/shower;Grab bars - toilet;Shower seat - built in  walk in shower     Prior Function   Level of Independence Independent with basic ADLs  husband assists with trash   Vocation Retired   Lexicographer, church activities, read, would like to return to horse therapy, swimming     Cognition   Overall Cognitive Status Within Functional Limits for tasks assessed     Sensation   Light Touch Impaired Detail   Light Touch Impaired  Details Impaired RLE;Impaired LLE   Hot/Cold Appears Intact   Proprioception Appears Intact     Coordination   Gross Motor Movements are Fluid and Coordinated Yes   Fine Motor Movements are Fluid and Coordinated No     ROM / Strength   AROM / PROM / Strength Strength     Strength   Overall Strength Deficits   Overall Strength Comments R hip flex 3/5, L hip flex 3+/5, B knee ext 4/5, B knee flex 3/5, B ankle PF 2/5, B ankle DF 2+/5     Transfers   Transfers Sit to Stand;Stand to Sit   Sit to Stand 6: Modified independent (Device/Increase time)   Stand to Sit 6: Modified independent (Device/Increase time)     Ambulation/Gait   Ambulation/Gait Yes   Ambulation/Gait Assistance 5: Supervision;4: Min guard   Ambulation/Gait Assistance Details Pt at mostly S level for gait with rollator, however had single instance of L foot slap causing minor unsteadiness.     Ambulation Distance (Feet)  115 Feet   Assistive device 4-wheeled walker   Gait Pattern Step-through pattern;Decreased stride length;Decreased dorsiflexion - right;Decreased dorsiflexion - left;Lateral hip instability;Trunk flexed   Ambulation Surface Level;Indoor   Gait velocity 1.06 ft/sec   Stairs Yes   Stairs Assistance 4: Min assist   Stairs Assistance Details (indicate cue type and reason) Note heavy reliance on UEs when ascending stairs   Stair Management Technique Two rails;Alternating pattern;Step to pattern;Forwards   Number of Stairs 4   Height of Stairs 6                           PT Education - 01/09/16 1929    Education provided Yes   Education Details Education on evaluation findings-great detail about neuropathy/balance and gait speed, goals, assessment for BLE bracing to increase safety, POC   Person(s) Educated Patient;Spouse   Methods Explanation   Comprehension Verbalized understanding;Returned demonstration          PT Short Term Goals - 01/09/16 1945      PT SHORT TERM GOAL #1   Title Pt will initiate HEP in order to improve BLE strength and functional mobility.  (Target Date: 02/06/16)   Time 4   Period Weeks   Status New     PT SHORT TERM GOAL #2   Title Will assess BERG score and improve score by 4 points in order to indicate decreased fall risk.     Time 4   Period Weeks   Status New     PT SHORT TERM GOAL #3   Title Pt will improve gait speed to 1.66 ft/sec w/ LRAD in order to indicate improved efficiency of gait.     Time 4   Period Weeks   Status New     PT SHORT TERM GOAL #4   Title Will have formal assessment of BLE bracing in order to reduce fall risk.     Time 4   Period Weeks   Status New     PT SHORT TERM GOAL #5   Title Pt will negotiate up/down 14 steps with single rail at mod I level in order to indicate safety when negotiating to bonus room at home.    Time 4   Period Weeks   Status New     Additional Short Term Goals    Additional Short Term Goals Yes     PT SHORT TERM GOAL #6   Title  Will assess 5TSS in order to better assess functional BLE strength.     Time 4   Period Weeks   Status New           PT Long Term Goals - 01/09/16 1950      PT LONG TERM GOAL #1   Title Pt will be independent with HEP in order to indicate improved functional mobility and decreased fall risk.  (Target Date: 03/06/15)   Time 8   Period Weeks   Status New     PT LONG TERM GOAL #2   Title Pt will improve BERG balance test 8 points from baseline in order to indicate decreased fall risk.     Time 8   Period Weeks   Status New     PT LONG TERM GOAL #3   Title Pt will ambulate 500' w/ LRAD and appropriate bracing for BLEs at mod I level in order to indicate safe return to community mobility.     Time 8   Period Weeks   Status New     PT LONG TERM GOAL #4   Title Pt will improve gait speed to 2.26 ft/sec w/ LRAD in order to indicate improved efficiency of gait and decreased fall risk.     Time 8   Period Weeks   Status New     PT LONG TERM GOAL #5   Title Pt will perform 5TSS test <20 seconds without UE support in order to indicate improved functional strength.     Time 8   Period Weeks   Status New     Additional Long Term Goals   Additional Long Term Goals Yes               Plan - 01/09/16 1933    Clinical Impression Statement Pt presents with Charcot [name] Tooth disease with B LE weakness, foot deformity, B foot drop and decreased balance.  Pt with history of falls with 1-2 falls in past 6 months.  Upon PT evaluation, note that gait speed is 1.06 ft/sec, indicative of recurrent fall risk, marked decrease BLE strength, and B foot drop that her current foot up braces are not controlling adequately.  Pt is of evolving presentation and moderate complexity from PT POC standpoint.  Pt will benefit from skilled OP neuro PT to address deficits.     Rehab Potential Good   Clinical Impairments Affecting Rehab  Potential progressive nature of neuropathy   PT Frequency 1x / week  then 2x/wk for 2 weeks   PT Duration 6 weeks  then 2x/wk for 2 weeks   PT Treatment/Interventions ADLs/Self Care Home Management;DME Instruction;Gait training;Stair training;Functional mobility training;Therapeutic activities;Therapeutic exercise;Balance training;Neuromuscular re-education;Orthotic Fit/Training;Patient/family education;Energy conservation;Vestibular   PT Next Visit Plan BERG, 5TSS, HEP for BLE strength, assess varying braces (unsure if our braces will work bc her foot is small), also she may need custom due to foot deformity-PT to have Gerald Stabs present at a PT session for better assessment).     Consulted and Agree with Plan of Care Patient;Family member/caregiver   Family Member Consulted husband      Patient will benefit from skilled therapeutic intervention in order to improve the following deficits and impairments:  Abnormal gait, Decreased activity tolerance, Decreased balance, Decreased knowledge of use of DME, Decreased mobility, Decreased strength, Impaired perceived functional ability, Impaired flexibility, Improper body mechanics, Postural dysfunction, Impaired sensation  Visit Diagnosis: Unsteadiness on feet - Plan: PT plan of care cert/re-cert  Other disturbances of  skin sensation - Plan: PT plan of care cert/re-cert  Muscle weakness (generalized) - Plan: PT plan of care cert/re-cert  Other abnormalities of gait and mobility - Plan: PT plan of care cert/re-cert      G-Codes - [id number]    Functional Assessment Tool Used Gait speed 1.06 ft/sec w/ rollator at min/guard level   Functional Limitation Mobility: Walking and moving around   Mobility: Walking and Moving Around Current Status [id number]) At least 60 percent but less than 80 percent impaired, limited or restricted   Mobility: Walking and Moving Around Goal Status [id number]) At least 1 percent but less than 20 percent impaired, limited or  restricted       Problem List There are no active problems to display for this patient.   Cameron Sprang, PT, MPT [facility] [address] [place], [place], [address] Phone: [phone]   Fax:  [phone] 01/09/16, 7:59 PM  Name: Hartense Delane MRN: [id number] Date of Birth: 07-17-1940

## 2016-01-23 ENCOUNTER — Encounter: Payer: Self-pay | Admitting: Rehabilitation

## 2016-01-23 ENCOUNTER — Ambulatory Visit: Payer: Medicare HMO | Attending: Diagnostic Neuroimaging | Admitting: Rehabilitation

## 2016-01-23 DIAGNOSIS — R2689 Other abnormalities of gait and mobility: Secondary | ICD-10-CM | POA: Insufficient documentation

## 2016-01-23 DIAGNOSIS — M6281 Muscle weakness (generalized): Secondary | ICD-10-CM

## 2016-01-23 DIAGNOSIS — R208 Other disturbances of skin sensation: Secondary | ICD-10-CM | POA: Diagnosis present

## 2016-01-23 DIAGNOSIS — R2681 Unsteadiness on feet: Secondary | ICD-10-CM | POA: Insufficient documentation

## 2016-01-23 NOTE — Patient Instructions (Signed)
Functional Quadriceps: Sit to Stand    Sit on edge of chair, feet flat on floor. Stand upright, extending knees fully.  Try to do without using your hands!! Repeat __10__ times per set. Do __1__ sets per session. Do __1-2__ sessions per day.  http://orth.exer.us/735   Copyright  VHI. All rights reserved.   Hip Flexion / Knee Extension: Straight-Leg Raise (Eccentric)    Lie on back. Lift leg with knee straight. Slowly lower leg for 3-5 seconds. __5_ reps per set, _2__ sets per day, __5-7_ days per week. Lower like elevator, stopping at each floor. Copyright  VHI. All rights reserved.   Abduction: Clam (Eccentric) - Side-Lying    Lie on side with knees bent. Lift top knee, keeping feet together. Keep trunk steady. Slowly lower for 3-5 seconds. _8-10__ reps per set, _2__ sets per day, __5-7_ days per week.   http://ecce.exer.us/65   Copyright  VHI. All rights reserved.      Seated Hip Flexion  ( Alternating Knee Raises)   Begin sitting with good posture. Activate your core stabilizing muscles to protect lumbar spine. Lift your left leg from the hip, keeping your knee bent,  hold, then return to the starting position. Lift your right leg, hold, then return to the starting position. Repeat for appropriate duration or repetitions.  Do 8-10 reps on each side.

## 2016-01-23 NOTE — Therapy (Signed)
[facility] [address] [place], [place], [address] Phone: [phone]   Fax:  [phone]  Physical Therapy Treatment  Patient Details  Name: Nina Rush MRN: [id number] Date of Birth: September 29, 1940 Referring Provider: Andrey Spearman  Encounter Date: 01/23/2016      PT End of Session - 01/23/16 1953    Visit Number 2   Number of Visits 11   Date for PT Re-Evaluation 03/09/16   Authorization Type MCR- Gcode on every 10th visit   PT Start Time 1531   PT Stop Time 1620   PT Time Calculation (min) 49 min   Activity Tolerance Patient tolerated treatment well   Behavior During Therapy [facility] for tasks assessed/performed      Past Medical History:  Diagnosis Date  . Charcot-Marie-Tooth disease   . Fuchs' adenoma    bilateral eyes  . Hypertension   . Lichen planus    scalp    Past Surgical History:  Procedure Laterality Date  . ABDOMINAL HYSTERECTOMY     total  . APPENDECTOMY    . CATARACT EXTRACTION Bilateral   . CHOLECYSTECTOMY    . LEG SURGERY     as child    There were no vitals filed for this visit.      Subjective Assessment - 01/23/16 1952    Subjective Pt reports no changes since last visit.    Patient is accompained by: Family member   Limitations House hold activities;Walking   Patient Stated Goals "I want to make sure I have the right kind of support for my feet since I have foot drop."     Currently in Pain? No/denies           TE:  Initiated HEP for BLE strength, see pt instruction for details on exercises and reps performed.    Self Care:  Discussion with pt and husband regarding goals for therapy, improving balance, strength and decreasing UE reliance with gait and balance.  Education to pt and husband regarding purpose of foot up brace.    Ortho fit train:  Gerald Stabs from [place] present during session to assess gait and possible bracing needs.  Gerald Stabs able to adjust foot up brace in  current shoe to allow increase pull and allow improved foot clearance during swing phase of gait.  Therefore pt did not need any other bracing at this time.  Pt and spouse verbalized understanding with marked improved noted with gait when foot up braces adjusted.                        PT Education - 01/23/16 1953    Education provided Yes   Education Details education on adjustments of B foot up braces, education on balance systems and compensations for neuropathy.    Person(s) Educated Patient;Spouse   Methods Explanation   Comprehension Verbalized understanding          PT Short Term Goals - 01/09/16 1945      PT SHORT TERM GOAL #1   Title Pt will initiate HEP in order to improve BLE strength and functional mobility.  (Target Date: 02/06/16)   Time 4   Period Weeks   Status New     PT SHORT TERM GOAL #2   Title Will assess BERG score and improve score by 4 points in order to indicate decreased fall risk.     Time 4   Period Weeks   Status New  PT SHORT TERM GOAL #3   Title Pt will improve gait speed to 1.66 ft/sec w/ LRAD in order to indicate improved efficiency of gait.     Time 4   Period Weeks   Status New     PT SHORT TERM GOAL #4   Title Will have formal assessment of BLE bracing in order to reduce fall risk.     Time 4   Period Weeks   Status New     PT SHORT TERM GOAL #5   Title Pt will negotiate up/down 14 steps with single rail at mod I level in order to indicate safety when negotiating to bonus room at home.    Time 4   Period Weeks   Status New     Additional Short Term Goals   Additional Short Term Goals Yes     PT SHORT TERM GOAL #6   Title Will assess 5TSS in order to better assess functional BLE strength.     Time 4   Period Weeks   Status New           PT Long Term Goals - 01/09/16 1950      PT LONG TERM GOAL #1   Title Pt will be independent with HEP in order to indicate improved functional mobility and decreased  fall risk.  (Target Date: 03/06/15)   Time 8   Period Weeks   Status New     PT LONG TERM GOAL #2   Title Pt will improve BERG balance test 8 points from baseline in order to indicate decreased fall risk.     Time 8   Period Weeks   Status New     PT LONG TERM GOAL #3   Title Pt will ambulate 500' w/ LRAD and appropriate bracing for BLEs at mod I level in order to indicate safe return to community mobility.     Time 8   Period Weeks   Status New     PT LONG TERM GOAL #4   Title Pt will improve gait speed to 2.26 ft/sec w/ LRAD in order to indicate improved efficiency of gait and decreased fall risk.     Time 8   Period Weeks   Status New     PT LONG TERM GOAL #5   Title Pt will perform 5TSS test <20 seconds without UE support in order to indicate improved functional strength.     Time 8   Period Weeks   Status New     Additional Long Term Goals   Additional Long Term Goals Yes               Plan - 01/23/16 1954    Clinical Impression Statement Skilled session with Gerald Stabs from [facility] [place] [facility] to adjust foot up brace vs get custom bracing (just had to adjust foot up brace) as well as providing pt with initial HEP for BLE strength.     Rehab Potential Good   Clinical Impairments Affecting Rehab Potential progressive nature of neuropathy   PT Frequency 1x / week  then 2x/wk for 2 weeks   PT Duration 6 weeks  then 2x/wk for 2 weeks   PT Treatment/Interventions ADLs/Self Care Home Management;DME Instruction;Gait training;Stair training;Functional mobility training;Therapeutic activities;Therapeutic exercise;Balance training;Neuromuscular re-education;Orthotic Fit/Training;Patient/family education;Energy conservation;Vestibular   PT Next Visit Plan BERG, add balance to HEP, continue to work on functional strength and balance with decreasing UE support.    Consulted and Agree with Plan of Care Patient;Family member/caregiver  Family Member Consulted husband      Patient will  benefit from skilled therapeutic intervention in order to improve the following deficits and impairments:  Abnormal gait, Decreased activity tolerance, Decreased balance, Decreased knowledge of use of DME, Decreased mobility, Decreased strength, Impaired perceived functional ability, Impaired flexibility, Improper body mechanics, Postural dysfunction, Impaired sensation  Visit Diagnosis: Unsteadiness on feet  Other disturbances of skin sensation  Muscle weakness (generalized)  Other abnormalities of gait and mobility     Problem List There are no active problems to display for this patient.   Denice Bors 01/23/2016, 7:57 PM  [facility] [address] [place] [address] [place], [place], [address] Phone: [phone]   Fax:  [phone]  Name: Nina Rush MRN: [id number] Date of Birth: 06-09-1940

## 2016-01-29 ENCOUNTER — Ambulatory Visit: Payer: Medicare HMO | Admitting: Physical Therapy

## 2016-01-29 ENCOUNTER — Encounter: Payer: Self-pay | Admitting: Physical Therapy

## 2016-01-29 DIAGNOSIS — R2681 Unsteadiness on feet: Secondary | ICD-10-CM | POA: Diagnosis not present

## 2016-01-29 DIAGNOSIS — M6281 Muscle weakness (generalized): Secondary | ICD-10-CM

## 2016-01-29 DIAGNOSIS — R2689 Other abnormalities of gait and mobility: Secondary | ICD-10-CM

## 2016-01-29 NOTE — Therapy (Signed)
[facility] [address] [place], [place], [address] Phone: [phone]   Fax:  [phone]  Physical Therapy Treatment  Patient Details  Name: Nina Rush MRN: [id number] Date of Birth: 11/01/1940 Referring Provider: Andrey Spearman  Encounter Date: 01/29/2016      PT End of Session - 01/29/16 1608    Visit Number 3   Number of Visits 11   Date for PT Re-Evaluation 03/09/16   Authorization Type MCR- Gcode on every 10th visit   PT Start Time 1326  pt arrived late   PT Stop Time 1355  pt wanted to leave early   PT Time Calculation (min) 29 min   Activity Tolerance Patient tolerated treatment well   Behavior During Therapy [facility] for tasks assessed/performed      Past Medical History:  Diagnosis Date  . Charcot-Marie-Tooth disease   . Fuchs' adenoma    bilateral eyes  . Hypertension   . Lichen planus    scalp    Past Surgical History:  Procedure Laterality Date  . ABDOMINAL HYSTERECTOMY     total  . APPENDECTOMY    . CATARACT EXTRACTION Bilateral   . CHOLECYSTECTOMY    . LEG SURGERY     as child    There were no vitals filed for this visit.      Subjective Assessment - 01/29/16 1328    Subjective Pt was able to work on HEP.   Patient is accompained by: Family member   Limitations House hold activities;Walking   Patient Stated Goals "I want to make sure I have the right kind of support for my feet since I have foot drop."     Currently in Pain? No/denies            [facility] PT Assessment - 01/29/16 0001      Standardized Balance Assessment   Standardized Balance Assessment Berg Balance Test     Berg Balance Test   Sit to Stand Needs minimal aid to stand or to stabilize   Standing Unsupported Unable to stand 30 seconds unassisted   Sitting with Back Unsupported but Feet Supported on Floor or Stool Able to sit safely and securely 2 minutes   Stand to Sit Uses backs of legs against chair to  control descent   Transfers Able to transfer with verbal cueing and /or supervision   Standing Unsupported with Eyes Closed Needs help to keep from falling   Standing Ubsupported with Feet Together Needs help to attain position and unable to hold for 15 seconds   From Standing, Reach Forward with Outstretched Arm Loses balance while trying/requires external support   From Standing Position, Pick up Object from Floor Unable to try/needs assist to keep balance   From Standing Position, Turn to Look Behind Over each Shoulder Needs assist to keep from losing balance and falling   Turn 360 Degrees Needs assistance while turning   Standing Unsupported, Alternately Place Feet on Step/Stool Needs assistance to keep from falling or unable to try   Standing Unsupported, One Foot in Front Loses balance while stepping or standing   Standing on One Leg Unable to try or needs assist to prevent fall   Total Score 9                     [facility] Adult PT Treatment/Exercise - 01/29/16 0001      Knee/Hip Exercises: Standing   Knee Flexion Strengthening;Both;1 set;10 reps   Hip Flexion Stengthening;Both;1  set;10 reps   Hip Extension Stengthening;Both;1 set;10 reps;Knee straight     Knee/Hip Exercises: Seated   Ball Squeeze x10,  5 second holds.   Clamshell with TheraBand Yellow  10x each                PT Education - 01/29/16 1354    Education provided Yes   Education Details Discussed results of Berg.   Person(s) Educated Patient   Methods Explanation   Comprehension Verbalized understanding          PT Short Term Goals - 01/09/16 1945      PT SHORT TERM GOAL #1   Title Pt will initiate HEP in order to improve BLE strength and functional mobility.  (Target Date: 02/06/16)   Time 4   Period Weeks   Status New     PT SHORT TERM GOAL #2   Title Will assess BERG score and improve score by 4 points in order to indicate decreased fall risk.     Time 4   Period Weeks    Status New     PT SHORT TERM GOAL #3   Title Pt will improve gait speed to 1.66 ft/sec w/ LRAD in order to indicate improved efficiency of gait.     Time 4   Period Weeks   Status New     PT SHORT TERM GOAL #4   Title Will have formal assessment of BLE bracing in order to reduce fall risk.     Time 4   Period Weeks   Status New     PT SHORT TERM GOAL #5   Title Pt will negotiate up/down 14 steps with single rail at mod I level in order to indicate safety when negotiating to bonus room at home.    Time 4   Period Weeks   Status New     Additional Short Term Goals   Additional Short Term Goals Yes     PT SHORT TERM GOAL #6   Title Will assess 5TSS in order to better assess functional BLE strength.     Time 4   Period Weeks   Status New           PT Long Term Goals - 01/09/16 1950      PT LONG TERM GOAL #1   Title Pt will be independent with HEP in order to indicate improved functional mobility and decreased fall risk.  (Target Date: 03/06/15)   Time 8   Period Weeks   Status New     PT LONG TERM GOAL #2   Title Pt will improve BERG balance test 8 points from baseline in order to indicate decreased fall risk.     Time 8   Period Weeks   Status New     PT LONG TERM GOAL #3   Title Pt will ambulate 500' w/ LRAD and appropriate bracing for BLEs at mod I level in order to indicate safe return to community mobility.     Time 8   Period Weeks   Status New     PT LONG TERM GOAL #4   Title Pt will improve gait speed to 2.26 ft/sec w/ LRAD in order to indicate improved efficiency of gait and decreased fall risk.     Time 8   Period Weeks   Status New     PT LONG TERM GOAL #5   Title Pt will perform 5TSS test <20 seconds without UE support in order to indicate  improved functional strength.     Time 8   Period Weeks   Status New     Additional Long Term Goals   Additional Long Term Goals Yes               Plan - 01/29/16 1609    Clinical Impression  Statement Pt scored 9/56 on Berg.  Progressed LE strengthening with standing exercises; pt was challenged but tolerated well alternating with seated exercises.   Rehab Potential Good   Clinical Impairments Affecting Rehab Potential progressive nature of neuropathy   PT Frequency 1x / week  then 2x/wk for 2 weeks   PT Duration 6 weeks  then 2x/wk for 2 weeks   PT Treatment/Interventions ADLs/Self Care Home Management;DME Instruction;Gait training;Stair training;Functional mobility training;Therapeutic activities;Therapeutic exercise;Balance training;Neuromuscular re-education;Orthotic Fit/Training;Patient/family education;Energy conservation;Vestibular   PT Next Visit Plan add balance to HEP, continue to work on functional strength and balance with decreasing UE support.    Consulted and Agree with Plan of Care Patient;Family member/caregiver   Family Member Consulted husband      Patient will benefit from skilled therapeutic intervention in order to improve the following deficits and impairments:  Abnormal gait, Decreased activity tolerance, Decreased balance, Decreased knowledge of use of DME, Decreased mobility, Decreased strength, Impaired perceived functional ability, Impaired flexibility, Improper body mechanics, Postural dysfunction, Impaired sensation  Visit Diagnosis: Unsteadiness on feet  Muscle weakness (generalized)  Other abnormalities of gait and mobility     Problem List There are no active problems to display for this patient.  Bjorn Loser, PTA  01/29/16, 4:14 PM [facility] [address] [place], [place], [address] Phone: [phone]   Fax:  [phone]  Name: Nina Rush MRN: [id number] Date of Birth: 28-Oct-1940

## 2016-01-29 NOTE — Patient Instructions (Signed)
Functional Quadriceps: Sit to Stand    Sit on edge of chair, feet flat on floor. Stand upright, extending knees fully.  Try to do without using your hands!! Repeat __10__ times per set. Do __1__ sets per session. Do __1-2__ sessions per day.  http://orth.exer.us/735   Copyright  VHI. All rights reserved.   Hip Flexion / Knee Extension: Straight-Leg Raise (Eccentric)    Lie on back. Lift leg with knee straight. Slowly lower leg for 3-5 seconds. __5_ reps per set, _2__ sets per day, __5-7_ days per week. Lower like elevator, stopping at each floor. Copyright  VHI. All rights reserved.   Abduction: Clam (Eccentric) - Side-Lying    Lie on side with knees bent. Lift top knee, keeping feet together. Keep trunk steady. Slowly lower for 3-5 seconds. _8-10__ reps per set, _2__ sets per day, __5-7_ days per week.   http://ecce.exer.us/65   Copyright  VHI. All rights reserved.      Seated Hip Flexion  ( Alternating Knee Raises)   Begin sitting with good posture. Activate your core stabilizing muscles to protect lumbar spine. Lift your left leg from the hip, keeping your knee bent,  hold, then return to the starting position. Lift your right leg, hold, then return to the starting position. Repeat for appropriate duration or repetitions.  Do 8-10 reps on each side.

## 2016-02-02 ENCOUNTER — Emergency Department (HOSPITAL_BASED_OUTPATIENT_CLINIC_OR_DEPARTMENT_OTHER)
Admission: EM | Admit: 2016-02-02 | Discharge: 2016-02-02 | Disposition: A | Discharge status: Home / Self Care | Payer: Medicare HMO | Attending: Emergency Medicine | Admitting: Emergency Medicine

## 2016-02-02 ENCOUNTER — Encounter (HOSPITAL_BASED_OUTPATIENT_CLINIC_OR_DEPARTMENT_OTHER): Payer: Self-pay | Admitting: *Deleted

## 2016-02-02 ENCOUNTER — Emergency Department (HOSPITAL_BASED_OUTPATIENT_CLINIC_OR_DEPARTMENT_OTHER): Payer: Medicare HMO

## 2016-02-02 DIAGNOSIS — I10 Essential (primary) hypertension: Secondary | ICD-10-CM | POA: Insufficient documentation

## 2016-02-02 DIAGNOSIS — Z79899 Other long term (current) drug therapy: Secondary | ICD-10-CM | POA: Insufficient documentation

## 2016-02-02 DIAGNOSIS — R350 Frequency of micturition: Secondary | ICD-10-CM | POA: Diagnosis present

## 2016-02-02 DIAGNOSIS — R5381 Other malaise: Secondary | ICD-10-CM | POA: Diagnosis not present

## 2016-02-02 DIAGNOSIS — R300 Dysuria: Secondary | ICD-10-CM | POA: Diagnosis not present

## 2016-02-02 DIAGNOSIS — R5383 Other fatigue: Secondary | ICD-10-CM | POA: Diagnosis not present

## 2016-02-02 LAB — URINALYSIS, ROUTINE W REFLEX MICROSCOPIC
Bilirubin Urine: NEGATIVE
GLUCOSE, UA: NEGATIVE mg/dL
Ketones, ur: NEGATIVE mg/dL
LEUKOCYTES UA: NEGATIVE
Nitrite: NEGATIVE
PROTEIN: NEGATIVE mg/dL
SPECIFIC GRAVITY, URINE: 1.009 (ref 1.005–1.030)
pH: 7 (ref 5.0–8.0)

## 2016-02-02 LAB — COMPREHENSIVE METABOLIC PANEL
ALBUMIN: 4.5 g/dL (ref 3.5–5.0)
ALK PHOS: 52 U/L (ref 38–126)
ALT: 24 U/L (ref 14–54)
ANION GAP: 7 (ref 5–15)
AST: 28 U/L (ref 15–41)
BILIRUBIN TOTAL: 0.6 mg/dL (ref 0.3–1.2)
BUN: 14 mg/dL (ref 6–20)
CO2: 32 mmol/L (ref 22–32)
Calcium: 9.9 mg/dL (ref 8.9–10.3)
Chloride: 102 mmol/L (ref 101–111)
Creatinine, Ser: <0.3 mg/dL — ABNORMAL LOW (ref 0.44–1.00)
GLUCOSE: 85 mg/dL (ref 65–99)
Potassium: 3.5 mmol/L (ref 3.5–5.1)
Sodium: 141 mmol/L (ref 135–145)
TOTAL PROTEIN: 6.7 g/dL (ref 6.5–8.1)

## 2016-02-02 LAB — CBC WITH DIFFERENTIAL/PLATELET
BASOS PCT: 0 %
Basophils Absolute: 0 10*3/uL (ref 0.0–0.1)
EOS ABS: 0 10*3/uL (ref 0.0–0.7)
EOS PCT: 0 %
HCT: 43.1 % (ref 36.0–46.0)
HEMOGLOBIN: 14.3 g/dL (ref 12.0–15.0)
Lymphocytes Relative: 29 %
Lymphs Abs: 1.5 10*3/uL (ref 0.7–4.0)
MCH: 31.2 pg (ref 26.0–34.0)
MCHC: 33.2 g/dL (ref 30.0–36.0)
MCV: 94.1 fL (ref 78.0–100.0)
MONOS PCT: 9 %
Monocytes Absolute: 0.4 10*3/uL (ref 0.1–1.0)
NEUTROS PCT: 62 %
Neutro Abs: 3.1 10*3/uL (ref 1.7–7.7)
PLATELETS: 218 10*3/uL (ref 150–400)
RBC: 4.58 MIL/uL (ref 3.87–5.11)
RDW: 13.2 % (ref 11.5–15.5)
WBC: 5.1 10*3/uL (ref 4.0–10.5)

## 2016-02-02 LAB — URINALYSIS, MICROSCOPIC (REFLEX): WBC, UA: NONE SEEN WBC/hpf (ref 0–5)

## 2016-02-02 LAB — TROPONIN I: Troponin I: <0.03 ng/mL (ref <0.03)

## 2016-02-02 NOTE — ED Triage Notes (Signed)
Pt reports dysuria, frequency and urgency x 3days. Also reports hematuria but states this is normal for her. Denies fever, n/v/d, back pain.

## 2016-02-02 NOTE — ED Provider Notes (Signed)
[facility] DEPT [facility] Provider Note   CSN: [id number] Arrival date & time: 02/02/16  [id number]     History   Chief Complaint Chief Complaint  Patient presents with  . Urinary Frequency    HPI Nina Rush is a 75 y.o. female.  HPI Patient for several days of severe fatigue and general weakness. Has had urinary frequency for 2 days. Patient reports frequent UTI. She suspects she has urinary tract infection. No fever, no chills, no flank pain or abdominal pain. No other recent changes to patient's medication or general health. Complete review of systems is otherwise negative. Past Medical History:  Diagnosis Date  . Charcot-Marie-Tooth disease   . Fuchs' adenoma    bilateral eyes  . Hypertension   . Lichen planus    scalp    There are no active problems to display for this patient.   Past Surgical History:  Procedure Laterality Date  . ABDOMINAL HYSTERECTOMY     total  . APPENDECTOMY    . CATARACT EXTRACTION Bilateral   . CHOLECYSTECTOMY    . LEG SURGERY     as child    OB History    No data available       Home Medications    Prior to Admission medications   Medication Sig Start Date End Date Taking? Authorizing Provider  clobetasol (TEMOVATE) 0.05 % external solution  04/27/13  Yes Historical Provider, MD  estradiol (ESTRACE) 0.1 MG/GM vaginal cream Apply pea size amount every other day as directed   Yes Historical Provider, MD  furosemide (LASIX) 20 MG tablet 20 mg daily. 10/18/15  Yes Historical Provider, MD  KLOR-CON M10 10 MEQ tablet 10 mEq. 10/18/15  Yes Historical Provider, MD  lisinopril (PRINIVIL,ZESTRIL) 10 MG tablet Take 10 mg by mouth daily.   Yes Historical Provider, MD  vitamin C (ASCORBIC ACID) 500 MG tablet 500 mg.   Yes Historical Provider, MD  ciprofloxacin (CIPRO) 500 MG tablet 500 mg daily. X 1 month 09/06/15   Historical Provider, MD  mirabegron ER (MYRBETRIQ) 25 MG TB24 tablet Take 25 mg by mouth daily.    Historical Provider, MD  solifenacin  (VESICARE) 10 MG tablet Take by mouth daily.    Historical Provider, MD    Family History Family History  Problem Relation Age of Onset  . Cancer - Lung Mother   . Stroke Father     Social History Social History  Substance Use Topics  . Smoking status: Never Smoker  . Smokeless tobacco: Never Used  . Alcohol use No     Allergies   Penicillins and Sulfa antibiotics   Review of Systems Review of Systems 10 Systems reviewed and are negative for acute change except as noted in the HPI.   Physical Exam Updated Vital Signs BP 161/84   Pulse 72   Temp 98.1 F (36.7 C) (Oral)   Resp 14   Ht 5\' 3"  (1.6 m)   Wt 114 lb (51.7 kg)   SpO2 100%   BMI 20.19 kg/m   Physical Exam  Constitutional: She is oriented to person, place, and time. She appears well-developed and well-nourished. No distress.  HENT:  Head: Normocephalic and atraumatic.  Bilateral TMs normal. Posterior oropharynx widely patent and clear without exudate. Dentition excellent condition.  Eyes: Conjunctivae and EOM are normal. Pupils are equal, round, and reactive to light.  Neck: Neck supple. No thyromegaly present.  Cardiovascular: Normal rate and regular rhythm.   No murmur heard. Pulmonary/Chest: Effort normal and breath  sounds normal. No respiratory distress.  Abdominal: Soft. She exhibits no distension and no mass. There is no tenderness. There is no guarding.  Musculoskeletal: She exhibits no edema.  Patient has chronic bony malformation of the feet. She however has no wounds or lesions. Cellulitis. 1+ edema bilateral ankles and feet.  Lymphadenopathy:    She has no cervical adenopathy.  Neurological: She is alert and oriented to person, place, and time. No cranial nerve deficit. She exhibits normal muscle tone. Coordination normal.  Skin: Skin is warm and dry. No rash noted.  Psychiatric: She has a normal mood and affect.  Nursing note and vitals reviewed.    ED Treatments / Results  Labs (all  labs ordered are listed, but only abnormal results are displayed) Labs Reviewed  URINALYSIS, ROUTINE W REFLEX MICROSCOPIC - Abnormal; Notable for the following:       Result Value   APPearance CLOUDY (*)    Hgb urine dipstick TRACE (*)    All other components within normal limits  URINALYSIS, MICROSCOPIC (REFLEX) - Abnormal; Notable for the following:    Bacteria, UA FEW (*)    Squamous Epithelial / LPF 0-5 (*)    All other components within normal limits  COMPREHENSIVE METABOLIC PANEL - Abnormal; Notable for the following:    Creatinine, Ser <0.30 (*)    All other components within normal limits  URINE CULTURE  TROPONIN I  CBC WITH DIFFERENTIAL/PLATELET    EKG  EKG Interpretation  Date/Time:  Sunday February 02 2016 11:19:08 EST Ventricular Rate:  75 PR Interval:    QRS Duration: 96 QT Interval:  391 QTC Calculation: 437 R Axis:   -12 Text Interpretation:  Sinus rhythm Abnormal R-wave progression, early transition Baseline wander in lead(s) I III aVL V3 no ischemic appearance, no sig change from previous Confirmed by Johnney Killian, MD, Jeannie Done [phone]) on 02/02/2016 12:09:24 PM Also confirmed by Johnney Killian, MD, Jeannie Done [phone]), editor [place], Joelene Millin [phone])  on 02/02/2016 12:18:15 PM       Radiology Dg Chest 2 View  Result Date: 02/02/2016 CLINICAL DATA:  Chest pain for 3 days.  Weakness. EXAM: CHEST  2 VIEW COMPARISON:  Plain film 09/23/2015.  CT 11/17/2013. FINDINGS: Left greater than right glenohumeral joint osteoarthritis. Convex right thoracic spine curvature. Midline trachea. Normal heart size and mediastinal contours. No pleural effusion or pneumothorax. Clear lungs. EKG lead artifact projects over the left upper lobe. IMPRESSION: No acute cardiopulmonary disease. Electronically Signed   By: Abigail Miyamoto M.D.   On: 02/02/2016 11:51    Procedures Procedures (including critical care time)  Medications Ordered in ED Medications - No data to display   Initial Impression /  Assessment and Plan / ED Course  I have reviewed the triage vital signs and the nursing notes.  Pertinent labs & imaging results that were available during my care of the patient were reviewed by me and considered in my medical decision making (see chart for details).  Clinical Course      Final Clinical Impressions(s) / ED Diagnoses   Final diagnoses:  Dysuria  Malaise and fatigue  Patient has had significant malaise and fatigue for several days. She suspected UTI. Urinalysis shows no signs of infection. General evaluation does not show other diagnostic results suggestive of  infectious illness or cardiac ischemia. At this time, patient will be given precautionary statement for return if changing or new symptoms. Instructions to follow-up with primary care doctor within the next several days. Urine culture will be pending but  no empiric antibiotics based on normal UA result.  New Prescriptions New Prescriptions   No medications on file     Charlesetta Shanks, MD 02/02/16 1250

## 2016-02-04 LAB — URINE CULTURE: Culture: NO GROWTH

## 2016-02-11 DIAGNOSIS — R319 Hematuria, unspecified: Secondary | ICD-10-CM | POA: Diagnosis not present

## 2016-02-11 DIAGNOSIS — N390 Urinary tract infection, site not specified: Secondary | ICD-10-CM | POA: Diagnosis not present

## 2016-02-11 DIAGNOSIS — R310 Gross hematuria: Secondary | ICD-10-CM | POA: Diagnosis not present

## 2016-02-14 ENCOUNTER — Encounter: Payer: Self-pay | Admitting: Rehabilitation

## 2016-02-14 ENCOUNTER — Ambulatory Visit: Payer: PPO | Attending: Diagnostic Neuroimaging | Admitting: Rehabilitation

## 2016-02-14 DIAGNOSIS — M6281 Muscle weakness (generalized): Secondary | ICD-10-CM

## 2016-02-14 DIAGNOSIS — R208 Other disturbances of skin sensation: Secondary | ICD-10-CM

## 2016-02-14 DIAGNOSIS — R2689 Other abnormalities of gait and mobility: Secondary | ICD-10-CM

## 2016-02-14 DIAGNOSIS — R2681 Unsteadiness on feet: Secondary | ICD-10-CM | POA: Diagnosis not present

## 2016-02-14 NOTE — Therapy (Signed)
[facility] [address] [place], [place], [address] Phone: [phone]   Fax:  [phone]  Physical Therapy Treatment  Patient Details  Name: Nina Rush MRN: [id number] Date of Birth: 1940/08/12 Referring Provider: Andrey Spearman  Encounter Date: 02/14/2016      PT End of Session - 02/14/16 1214    Visit Number 4   Number of Visits 11   Date for PT Re-Evaluation 03/09/16   Authorization Type MCR- Gcode on every 10th visit   PT Start Time 1158   PT Stop Time 1245   PT Time Calculation (min) 47 min   Activity Tolerance Patient tolerated treatment well   Behavior During Therapy [facility] for tasks assessed/performed      Past Medical History:  Diagnosis Date  . Charcot-Marie-Tooth disease   . Fuchs' adenoma    bilateral eyes  . Hypertension   . Lichen planus    scalp    Past Surgical History:  Procedure Laterality Date  . ABDOMINAL HYSTERECTOMY     total  . APPENDECTOMY    . CATARACT EXTRACTION Bilateral   . CHOLECYSTECTOMY    . LEG SURGERY     as child    There were no vitals filed for this visit.      Subjective Assessment - 02/14/16 1204    Subjective Pt reports she is doing exercises sporadically.     Patient is accompained by: Family member   Limitations House hold activities;Walking   Patient Stated Goals "I want to make sure I have the right kind of support for my feet since I have foot drop."     Currently in Pain? No/denies                         [facility] Adult PT Treatment/Exercise - 02/14/16 0001      Transfers   Transfers Sit to Stand;Stand to Sit   Sit to Stand 6: Modified independent (Device/Increase time)   Five time sit to stand comments  15.34 secs with BUE support   Stand to Sit 6: Modified independent (Device/Increase time)   Comments Educated to begin to perform at home with single UE support only as she was able to do so in session.      Ambulation/Gait    Ambulation/Gait Yes   Ambulation/Gait Assistance 5: Supervision;4: Min guard   Ambulation Distance (Feet) 200 Feet   Assistive device 4-wheeled walker   Gait Pattern Step-through pattern;Decreased stride length;Decreased dorsiflexion - right;Decreased dorsiflexion - left;Lateral hip instability;Trunk flexed   Ambulation Surface Level;Indoor   Gait velocity 1.84 ft/sec with rollator   Stairs Yes   Stairs Assistance 5: Supervision   Stairs Assistance Details (indicate cue type and reason) Pt unable to safely negotiate stairs with single rail at this time, but is able to perform at S level x 16 steps with B rails.  encouraged her to begin this at home 2 times daily to increase BLE strength.     Stair Management Technique Two rails;Alternating pattern;Step to pattern;Forwards   Number of Stairs 16   Height of Stairs 6     Exercises   Exercises Other Exercises   Other Exercises  went through curent HEP per pt request to ensure technique.  Performed SL clam x 10 reps, supine SLR with cues for decreased height for improved technqiue x 7 reps, seated marching x 5 reps BLE with cues for posture and scooting to EOC.  PT Education - 02/14/16 1214    Education provided Yes   Education Details Educated on importance of compliance with HEP daily and to perform stairs in home 2x/day.  Education regarding progression of balance and strengthening exercises and conitnue to educate on gait speed.  Pt still with concerns that foot up brace and positioning and furthermore recommended that she may get company that made her shoes to stitch plastic piece to shoe    Person(s) Educated Patient;Spouse   Methods Explanation   Comprehension Verbalized understanding          PT Short Term Goals - 02/14/16 1214      PT SHORT TERM GOAL #1   Title Pt will initiate HEP in order to improve BLE strength and functional mobility.  (Target Date: 02/06/16)   Baseline pt doing intermittently at  home.    Time 4   Period Weeks   Status Partially Met     PT SHORT TERM GOAL #2   Title Will assess BERG score and improve score by 4 points in order to indicate decreased fall risk.     Baseline 9/56 on 01/29/16   Time 4   Period Weeks   Status Achieved     PT SHORT TERM GOAL #3   Title Pt will improve gait speed to 1.66 ft/sec w/ LRAD in order to indicate improved efficiency of gait.     Baseline 1.84 ft/sec with rollator on 02/14/16   Time 4   Period Weeks   Status Achieved     PT SHORT TERM GOAL #4   Title Will have formal assessment of BLE bracing in order to reduce fall risk.     Baseline met    Time 4   Period Weeks   Status Achieved     PT SHORT TERM GOAL #5   Title Pt will negotiate up/down 14 steps with single rail at mod I level in order to indicate safety when negotiating to bonus room at home.    Time 4   Period Weeks   Status Not Met     PT SHORT TERM GOAL #6   Title Will assess 5TSS in order to better assess functional BLE strength.     Baseline 15.34 secs with BUE support on 02/14/16   Time 4   Period Weeks   Status Achieved           PT Long Term Goals - 01/09/16 1950      PT LONG TERM GOAL #1   Title Pt will be independent with HEP in order to indicate improved functional mobility and decreased fall risk.  (Target Date: 03/06/15)   Time 8   Period Weeks   Status New     PT LONG TERM GOAL #2   Title Pt will improve BERG balance test 8 points from baseline in order to indicate decreased fall risk.     Time 8   Period Weeks   Status New     PT LONG TERM GOAL #3   Title Pt will ambulate 500' w/ LRAD and appropriate bracing for BLEs at mod I level in order to indicate safe return to community mobility.     Time 8   Period Weeks   Status New     PT LONG TERM GOAL #4   Title Pt will improve gait speed to 2.26 ft/sec w/ LRAD in order to indicate improved efficiency of gait and decreased fall risk.     Time 8  Period Weeks   Status New     PT  LONG TERM GOAL #5   Title Pt will perform 5TSS test <20 seconds without UE support in order to indicate improved functional strength.     Time 8   Period Weeks   Status New     Additional Long Term Goals   Additional Long Term Goals Yes               Plan - 02/14/16 1214    Clinical Impression Statement Skilled session focused on assessment of STGs (pt has only been seen for 4 visits, however due to not seeing pt for two weeks, assessed goals to re-establish where she is currently).  She has met 3/5 STGs and partially met 5th goal for HEP, however was unable to perform stairs at mod I level with single rail.  Pt making gains and therefore recommend continue POC.     Rehab Potential Good   Clinical Impairments Affecting Rehab Potential progressive nature of neuropathy   PT Frequency 1x / week  then 2x/wk for 2 weeks   PT Duration 6 weeks  then 2x/wk for 2 weeks   PT Treatment/Interventions ADLs/Self Care Home Management;DME Instruction;Gait training;Stair training;Functional mobility training;Therapeutic activities;Therapeutic exercise;Balance training;Neuromuscular re-education;Orthotic Fit/Training;Patient/family education;Energy conservation;Vestibular   PT Next Visit Plan add balance to HEP, continue to work on functional strength and balance with decreasing UE support.    Consulted and Agree with Plan of Care Patient;Family member/caregiver   Family Member Consulted husband      Patient will benefit from skilled therapeutic intervention in order to improve the following deficits and impairments:  Abnormal gait, Decreased activity tolerance, Decreased balance, Decreased knowledge of use of DME, Decreased mobility, Decreased strength, Impaired perceived functional ability, Impaired flexibility, Improper body mechanics, Postural dysfunction, Impaired sensation  Visit Diagnosis: Unsteadiness on feet  Muscle weakness (generalized)  Other abnormalities of gait and  mobility  Other disturbances of skin sensation     Problem List There are no active problems to display for this patient.   Cameron Sprang, PT, MPT [facility] [address] [place], [place], [address] Phone: [phone]   Fax:  [phone] 02/14/16, 4:48 PM  Name: Nina Rush MRN: [id number] Date of Birth: [birth year]

## 2016-02-21 ENCOUNTER — Ambulatory Visit: Payer: PPO | Admitting: Rehabilitation

## 2016-02-21 DIAGNOSIS — R2681 Unsteadiness on feet: Secondary | ICD-10-CM | POA: Diagnosis not present

## 2016-02-21 DIAGNOSIS — R2689 Other abnormalities of gait and mobility: Secondary | ICD-10-CM

## 2016-02-21 DIAGNOSIS — M6281 Muscle weakness (generalized): Secondary | ICD-10-CM

## 2016-02-21 NOTE — Patient Instructions (Signed)
   Use your step in the garage and still hang on with your hands but focus on using your legs more to boost you up onto the step.  Start with left foot leading each time x 10 reps and then lead with right leg x 10 reps.     Knee High   Holding stable object, raise knee to hip level, then lower knee. Repeat with other knee. Complete __10_ repetitions. Do __2__ sessions per day.  ABDUCTION: Standing (Active)   Stand facing counter top for both hands to have support, feet flat. Lift right leg out to side.  Complete __10_ repetitions. Perform __2_ sessions per day.    EXTENSION: Standing (Active)  Stand facing counter top for both hands to have support, both feet flat. Draw right leg behind body as far as possible.  Complete 10 repetitions. Perform __2_ sessions per day.  Copyright  VHI. All rights reserved.     FLEXION: Standing - Unstable (Active)    Standing on floor (not on foam) facing counter top for both hands to have support, bend right knee backward, moving heel toward buttocks.  Complete _1__ sets of _10__ repetitions. Perform _1-2__ sessions per day.  Copyright  VHI. All rights reserved.

## 2016-02-21 NOTE — Therapy (Signed)
[facility] [address] [place], [place], [address] Phone: [phone]   Fax:  [phone]  Physical Therapy Treatment  Patient Details  Name: Nina Rush MRN: [id number] Date of Birth: 11/11/1940 Referring Provider: Andrey Spearman  Encounter Date: 02/21/2016      PT End of Session - 02/21/16 1159    Visit Number 5   Number of Visits 11   Date for PT Re-Evaluation 03/09/16   Authorization Type MCR- Gcode on every 10th visit   PT Start Time 1150  pt went to restroom first   PT Stop Time 1235   PT Time Calculation (min) 45 min   Activity Tolerance Patient tolerated treatment well   Behavior During Therapy [facility] for tasks assessed/performed      Past Medical History:  Diagnosis Date  . Charcot-Marie-Tooth disease   . Fuchs' adenoma    bilateral eyes  . Hypertension   . Lichen planus    scalp    Past Surgical History:  Procedure Laterality Date  . ABDOMINAL HYSTERECTOMY     total  . APPENDECTOMY    . CATARACT EXTRACTION Bilateral   . CHOLECYSTECTOMY    . LEG SURGERY     as child    There were no vitals filed for this visit.      Subjective Assessment - 02/21/16 1157    Subjective Pt reports increased back pain yesterday.     Patient is accompained by: Family member   Limitations House hold activities;Walking   Patient Stated Goals "I want to make sure I have the right kind of support for my feet since I have foot drop."     Currently in Pain? No/denies           Self Care:  Discussed why making modifications to HEP as well as performing nustep at their gym.    Therex:  Performed standing HEP, see pt instruction for details.  Also performed seated nustep x 5 mins with BUEs/LEs at level 3 resistance maintaining pace in 40's for steps per minute.                        PT Education - 02/21/16 1159    Education provided Yes   Education Details modification made to HEP, see  pt instruction   Person(s) Educated Patient;Spouse   Methods Explanation;Demonstration;Handout   Comprehension Verbalized understanding;Returned demonstration          PT Short Term Goals - 02/14/16 1214      PT SHORT TERM GOAL #1   Title Pt will initiate HEP in order to improve BLE strength and functional mobility.  (Target Date: 02/06/16)   Baseline pt doing intermittently at home.    Time 4   Period Weeks   Status Partially Met     PT SHORT TERM GOAL #2   Title Will assess BERG score and improve score by 4 points in order to indicate decreased fall risk.     Baseline 9/56 on 01/29/16   Time 4   Period Weeks   Status Achieved     PT SHORT TERM GOAL #3   Title Pt will improve gait speed to 1.66 ft/sec w/ LRAD in order to indicate improved efficiency of gait.     Baseline 1.84 ft/sec with rollator on 02/14/16   Time 4   Period Weeks   Status Achieved     PT SHORT TERM GOAL #4   Title Will have formal  assessment of BLE bracing in order to reduce fall risk.     Baseline met    Time 4   Period Weeks   Status Achieved     PT SHORT TERM GOAL #5   Title Pt will negotiate up/down 14 steps with single rail at mod I level in order to indicate safety when negotiating to bonus room at home.    Time 4   Period Weeks   Status Not Met     PT SHORT TERM GOAL #6   Title Will assess 5TSS in order to better assess functional BLE strength.     Baseline 15.34 secs with BUE support on 02/14/16   Time 4   Period Weeks   Status Achieved           PT Long Term Goals - 01/09/16 1950      PT LONG TERM GOAL #1   Title Pt will be independent with HEP in order to indicate improved functional mobility and decreased fall risk.  (Target Date: 03/06/15)   Time 8   Period Weeks   Status New     PT LONG TERM GOAL #2   Title Pt will improve BERG balance test 8 points from baseline in order to indicate decreased fall risk.     Time 8   Period Weeks   Status New     PT LONG TERM GOAL #3    Title Pt will ambulate 500' w/ LRAD and appropriate bracing for BLEs at mod I level in order to indicate safe return to community mobility.     Time 8   Period Weeks   Status New     PT LONG TERM GOAL #4   Title Pt will improve gait speed to 2.26 ft/sec w/ LRAD in order to indicate improved efficiency of gait and decreased fall risk.     Time 8   Period Weeks   Status New     PT LONG TERM GOAL #5   Title Pt will perform 5TSS test <20 seconds without UE support in order to indicate improved functional strength.     Time 8   Period Weeks   Status New     Additional Long Term Goals   Additional Long Term Goals Yes               Plan - 02/21/16 1348    Clinical Impression Statement Skilled session focused on performing standing BLE strengthening with education on how to incorporate balance into task.  Pt tolerated very nicely and therefore added to HEP, see pt instruction.     Rehab Potential Good   Clinical Impairments Affecting Rehab Potential progressive nature of neuropathy   PT Frequency 1x / week  then 2x/wk for 2 weeks   PT Duration 6 weeks  then 2x/wk for 2 weeks   PT Treatment/Interventions ADLs/Self Care Home Management;DME Instruction;Gait training;Stair training;Functional mobility training;Therapeutic activities;Therapeutic exercise;Balance training;Neuromuscular re-education;Orthotic Fit/Training;Patient/family education;Energy conservation;Vestibular   PT Next Visit Plan add balance to HEP, continue to work on functional strength and balance with decreasing UE support.    Consulted and Agree with Plan of Care Patient;Family member/caregiver   Family Member Consulted husband      Patient will benefit from skilled therapeutic intervention in order to improve the following deficits and impairments:  Abnormal gait, Decreased activity tolerance, Decreased balance, Decreased knowledge of use of DME, Decreased mobility, Decreased strength, Impaired perceived  functional ability, Impaired flexibility, Improper body mechanics, Postural dysfunction, Impaired sensation  Visit Diagnosis: Unsteadiness on feet  Muscle weakness (generalized)  Other abnormalities of gait and mobility     Problem List There are no active problems to display for this patient.   Cameron Sprang, PT, MPT [facility] [address] [place], [place], [address] Phone: [phone]   Fax:  [phone] 02/21/16, 1:51 PM  Name: Kori Goins MRN: [id number] Date of Birth: 1940/04/08

## 2016-02-28 ENCOUNTER — Ambulatory Visit: Payer: PPO | Admitting: Rehabilitation

## 2016-03-06 ENCOUNTER — Ambulatory Visit: Payer: PPO | Admitting: Rehabilitation

## 2016-03-06 ENCOUNTER — Encounter: Payer: Self-pay | Admitting: Rehabilitation

## 2016-03-06 DIAGNOSIS — R208 Other disturbances of skin sensation: Secondary | ICD-10-CM

## 2016-03-06 DIAGNOSIS — R2681 Unsteadiness on feet: Secondary | ICD-10-CM | POA: Diagnosis not present

## 2016-03-06 DIAGNOSIS — R2689 Other abnormalities of gait and mobility: Secondary | ICD-10-CM

## 2016-03-06 DIAGNOSIS — M6281 Muscle weakness (generalized): Secondary | ICD-10-CM

## 2016-03-06 NOTE — Therapy (Signed)
[facility] [address] [place], [place], [address] Phone: [phone]   Fax:  [phone]  Physical Therapy Treatment  Patient Details  Name: Nina Rush MRN: [id number] Date of Birth: 01-03-1941 Referring Provider: Andrey Spearman  Encounter Date: 03/06/2016      PT End of Session - 03/06/16 1632    Visit Number 6   Number of Visits 11   Date for PT Re-Evaluation 03/09/16   Authorization Type MCR- Gcode on every 10th visit   PT Start Time 1200  pt late and then needed to use restroom   PT Stop Time 1230   PT Time Calculation (min) 30 min   Activity Tolerance Patient tolerated treatment well   Behavior During Therapy [facility] for tasks assessed/performed      Past Medical History:  Diagnosis Date  . Charcot-Marie-Tooth disease   . Fuchs' adenoma    bilateral eyes  . Hypertension   . Lichen planus    scalp    Past Surgical History:  Procedure Laterality Date  . ABDOMINAL HYSTERECTOMY     total  . APPENDECTOMY    . CATARACT EXTRACTION Bilateral   . CHOLECYSTECTOMY    . LEG SURGERY     as child    There were no vitals filed for this visit.      Subjective Assessment - 03/06/16 1606    Subjective Pt reports not performing steps, however she has been more compliant with other exercises.    Patient is accompained by: Family member   Limitations House hold activities;Walking   Patient Stated Goals "I want to make sure I have the right kind of support for my feet since I have foot drop."     Currently in Pain? No/denies                         [facility] Adult PT Treatment/Exercise - 03/06/16 0001      Neuro Re-ed    Neuro Re-ed Details  Performed static standing in // bars without UE support.  Pt unable to tolerate more than a few seconds without support, therefore kept single UE support as needed x 3 sets of 30 secs.  Had pt performing standing on foam airdex pad in order to work on  elicitation of hip strategy, again this was very difficult and worked with intermittent UE support.  Feet together solid ground, again with intermittent light support x 3 reps of 30 secs.       Exercises   Exercises Other Exercises   Other Exercises  Pt stating that she feel that R groin is tight when trying to cross RLE over LLE, therefore futher assessed in supine.  Had pt perform butterfly stretch however maintaining LLE in midline and lowering RLE out to the side, note marked increased in tightness in RLE and PT had to provide support to prevent over stretch.  Performed x 2 mins and provided this for HEP, see pt instruction.  Performed side stepping over target (stepping R over target then back to the L) with emphasis on increased hip flex and then increasing speed to imrpove endurance x 10 reps each side.  Also performed forward/backward stepping over target again x 10 reps, both performing with light BUE support to also address balance.  Performed standing knee flex x 10 reps on each side with emphasis on decreased hip and trunk flexion to complete task.  PT Education - 03/06/16 1631    Education provided Yes   Education Details additional stretch for HEP   Person(s) Educated Patient;Spouse   Methods Explanation;Demonstration;Handout   Comprehension Verbalized understanding;Returned demonstration          PT Short Term Goals - 02/14/16 1214      PT SHORT TERM GOAL #1   Title Pt will initiate HEP in order to improve BLE strength and functional mobility.  (Target Date: 02/06/16)   Baseline pt doing intermittently at home.    Time 4   Period Weeks   Status Partially Met     PT SHORT TERM GOAL #2   Title Will assess BERG score and improve score by 4 points in order to indicate decreased fall risk.     Baseline 9/56 on 01/29/16   Time 4   Period Weeks   Status Achieved     PT SHORT TERM GOAL #3   Title Pt will improve gait speed to 1.66 ft/sec w/ LRAD in  order to indicate improved efficiency of gait.     Baseline 1.84 ft/sec with rollator on 02/14/16   Time 4   Period Weeks   Status Achieved     PT SHORT TERM GOAL #4   Title Will have formal assessment of BLE bracing in order to reduce fall risk.     Baseline met    Time 4   Period Weeks   Status Achieved     PT SHORT TERM GOAL #5   Title Pt will negotiate up/down 14 steps with single rail at mod I level in order to indicate safety when negotiating to bonus room at home.    Time 4   Period Weeks   Status Not Met     PT SHORT TERM GOAL #6   Title Will assess 5TSS in order to better assess functional BLE strength.     Baseline 15.34 secs with BUE support on 02/14/16   Time 4   Period Weeks   Status Achieved           PT Long Term Goals - 01/09/16 1950      PT LONG TERM GOAL #1   Title Pt will be independent with HEP in order to indicate improved functional mobility and decreased fall risk.  (Target Date: 03/06/15)   Time 8   Period Weeks   Status New     PT LONG TERM GOAL #2   Title Pt will improve BERG balance test 8 points from baseline in order to indicate decreased fall risk.     Time 8   Period Weeks   Status New     PT LONG TERM GOAL #3   Title Pt will ambulate 500' w/ LRAD and appropriate bracing for BLEs at mod I level in order to indicate safe return to community mobility.     Time 8   Period Weeks   Status New     PT LONG TERM GOAL #4   Title Pt will improve gait speed to 2.26 ft/sec w/ LRAD in order to indicate improved efficiency of gait and decreased fall risk.     Time 8   Period Weeks   Status New     PT LONG TERM GOAL #5   Title Pt will perform 5TSS test <20 seconds without UE support in order to indicate improved functional strength.     Time 8   Period Weeks   Status New     Additional Long  Term Goals   Additional Long Term Goals Yes               Plan - 03/06/16 1632    Clinical Impression Statement Shortened session today due to  pt late to session and needing to use restroom prior to session.   Focused on standing balance and BLE strength/endurance.  Tolerated well.     Rehab Potential Good   Clinical Impairments Affecting Rehab Potential progressive nature of neuropathy   PT Frequency 1x / week  then 2x/wk for 2 weeks   PT Duration 6 weeks  then 2x/wk for 2 weeks   PT Treatment/Interventions ADLs/Self Care Home Management;DME Instruction;Gait training;Stair training;Functional mobility training;Therapeutic activities;Therapeutic exercise;Balance training;Neuromuscular re-education;Orthotic Fit/Training;Patient/family education;Energy conservation;Vestibular   PT Next Visit Plan check goals and extend POC.  add balance to HEP, continue to work on functional strength and balance with decreasing UE support.    Consulted and Agree with Plan of Care Patient;Family member/caregiver   Family Member Consulted husband      Patient will benefit from skilled therapeutic intervention in order to improve the following deficits and impairments:  Abnormal gait, Decreased activity tolerance, Decreased balance, Decreased knowledge of use of DME, Decreased mobility, Decreased strength, Impaired perceived functional ability, Impaired flexibility, Improper body mechanics, Postural dysfunction, Impaired sensation  Visit Diagnosis: Unsteadiness on feet  Muscle weakness (generalized)  Other abnormalities of gait and mobility  Other disturbances of skin sensation     Problem List There are no active problems to display for this patient.   Cameron Sprang, PT, MPT [facility] [address] [place], [place], [address] Phone: [phone]   Fax:  [phone] 03/06/16, 5:40 PM  Name: Nina Rush MRN: [id number] Date of Birth: [birth year]

## 2016-03-06 NOTE — Patient Instructions (Signed)
Adductor Stretch - Supine    Lie on floor, knees bent, feet flat. Keeping feet together, lower knees toward floor until stretch felt at inner thighs.  Keep the left leg bent in the middle and lower the right leg out to the side (you may need to put a couple of pillows on the right side for support but relax and leg gravity take leg down as far as you can go).  Hold for at least 1 min and repeat 2-3 times per day.

## 2016-03-09 ENCOUNTER — Ambulatory Visit: Payer: PPO | Admitting: Rehabilitation

## 2016-03-09 ENCOUNTER — Encounter: Payer: Self-pay | Admitting: Rehabilitation

## 2016-03-09 DIAGNOSIS — R2681 Unsteadiness on feet: Secondary | ICD-10-CM

## 2016-03-09 DIAGNOSIS — R2689 Other abnormalities of gait and mobility: Secondary | ICD-10-CM

## 2016-03-09 DIAGNOSIS — M6281 Muscle weakness (generalized): Secondary | ICD-10-CM

## 2016-03-09 DIAGNOSIS — R208 Other disturbances of skin sensation: Secondary | ICD-10-CM

## 2016-03-09 NOTE — Therapy (Signed)
[facility] [address] [place], [place], [address] Phone: [phone]   Fax:  [phone]  Physical Therapy Treatment  Patient Details  Name: Nina Rush MRN: [id number] Date of Birth: 1940-05-20 Referring Provider: Andrey Spearman  Encounter Date: 03/09/2016      PT End of Session - 03/09/16 1230    Visit Number 7   Number of Visits 19   Date for PT Re-Evaluation 04/08/16   Authorization Type MCR- Gcode on every 10th visit   PT Start Time 1155  pt late to session   PT Stop Time 1235   PT Time Calculation (min) 40 min   Activity Tolerance Patient tolerated treatment well   Behavior During Therapy [facility] for tasks assessed/performed      Past Medical History:  Diagnosis Date  . Charcot-Marie-Tooth disease   . Fuchs' adenoma    bilateral eyes  . Hypertension   . Lichen planus    scalp    Past Surgical History:  Procedure Laterality Date  . ABDOMINAL HYSTERECTOMY     total  . APPENDECTOMY    . CATARACT EXTRACTION Bilateral   . CHOLECYSTECTOMY    . LEG SURGERY     as child    There were no vitals filed for this visit.      Subjective Assessment - 03/09/16 1157    Subjective No changes, no falls.     Patient is accompained by: Family member   Limitations House hold activities;Walking   Patient Stated Goals "I want to make sure I have the right kind of support for my feet since I have foot drop."     Currently in Pain? No/denies                         [facility] Adult PT Treatment/Exercise - 03/09/16 1202      Ambulation/Gait   Ambulation/Gait Yes   Ambulation/Gait Assistance 6: Modified independent (Device/Increase time)   Ambulation/Gait Assistance Details Mod I for >500' with rollator over indoor surfaces to simulate return to community.    Ambulation Distance (Feet) 525 Feet   Assistive device 4-wheeled walker   Gait Pattern Step-through pattern;Decreased stride  length;Decreased dorsiflexion - right;Decreased dorsiflexion - left;Lateral hip instability;Trunk flexed   Ambulation Surface Level;Indoor     Standardized Balance Assessment   Standardized Balance Assessment Berg Balance Test     Berg Balance Test   Sit to Stand Able to stand  independently using hands   Standing Unsupported Able to stand 30 seconds unsupported   Sitting with Back Unsupported but Feet Supported on Floor or Stool Able to sit safely and securely 2 minutes   Stand to Sit Controls descent by using hands   Transfers Able to transfer safely, definite need of hands   Standing Unsupported with Eyes Closed Needs help to keep from falling   Standing Ubsupported with Feet Together Needs help to attain position and unable to hold for 15 seconds   From Standing, Reach Forward with Outstretched Arm Reaches forward but needs supervision   From Standing Position, Pick up Object from Floor Unable to try/needs assist to keep balance   From Standing Position, Turn to Look Behind Over each Shoulder Needs assist to keep from losing balance and falling   Turn 360 Degrees Needs assistance while turning   Standing Unsupported, Alternately Place Feet on Step/Stool Needs assistance to keep from falling or unable to try   Standing Unsupported, One Foot in  Front Loses balance while stepping or standing   Standing on One Leg Unable to try or needs assist to prevent fall   Total Score 16     Self-Care   Self-Care Other Self-Care Comments   Other Self-Care Comments  PT discussed coming up on end of POC and that we would renew for 4 more weeks and that PT feels that pt could benefit from some time away from therapy following this renewal to work independently at home to continue to make progress and return in 3 months or so.  Educated pt and spouse on how to keep track of her own progress at home with spreadsheet and continue to discuss how to progress balance and strength exercises.  Both verbalized  understanding.                 PT Education - 03/09/16 1158    Education provided Yes   Education Details Education on ending therapy after four weeks to allow pt to work and progress at home for up to 3 months before returning.    Person(s) Educated Patient;Spouse   Methods Explanation   Comprehension Verbalized understanding          PT Short Term Goals - 02/14/16 1214      PT SHORT TERM GOAL #1   Title Pt will initiate HEP in order to improve BLE strength and functional mobility.  (Target Date: 02/06/16)   Baseline pt doing intermittently at home.    Time 4   Period Weeks   Status Partially Met     PT SHORT TERM GOAL #2   Title Will assess BERG score and improve score by 4 points in order to indicate decreased fall risk.     Baseline 9/56 on 01/29/16   Time 4   Period Weeks   Status Achieved     PT SHORT TERM GOAL #3   Title Pt will improve gait speed to 1.66 ft/sec w/ LRAD in order to indicate improved efficiency of gait.     Baseline 1.84 ft/sec with rollator on 02/14/16   Time 4   Period Weeks   Status Achieved     PT SHORT TERM GOAL #4   Title Will have formal assessment of BLE bracing in order to reduce fall risk.     Baseline met    Time 4   Period Weeks   Status Achieved     PT SHORT TERM GOAL #5   Title Pt will negotiate up/down 14 steps with single rail at mod I level in order to indicate safety when negotiating to bonus room at home.    Time 4   Period Weeks   Status Not Met     PT SHORT TERM GOAL #6   Title Will assess 5TSS in order to better assess functional BLE strength.     Baseline 15.34 secs with BUE support on 02/14/16   Time 4   Period Weeks   Status Achieved           PT Long Term Goals - 03/09/16 1159      PT LONG TERM GOAL #1   Title Pt will be independent with HEP in order to indicate improved functional mobility and decreased fall risk.  (Target Date: 03/06/15)   Baseline met 03/09/16   Time 8   Period Weeks   Status  Achieved     PT LONG TERM GOAL #2   Title Pt will improve BERG balance test to 20/56 in  order to indicate decreased fall risk.     Baseline 16/56, 7 point improvement on 03/09/16   Time 8   Period Weeks   Status Partially Met  change to revised next visit     PT LONG TERM GOAL #3   Title Pt will ambulate 500' w/ LRAD and appropriate bracing for BLEs at mod I level in order to indicate safe return to community mobility.     Baseline met 03/09/16   Time 8   Period Weeks   Status Achieved     PT LONG TERM GOAL #4   Title Pt will improve gait speed to 2.26 ft/sec w/ LRAD in order to indicate improved efficiency of gait and decreased fall risk.     Time 8   Period Weeks   Status On-going     PT LONG TERM GOAL #5   Title Pt will perform 5TSS test <20 seconds without UE support in order to indicate improved functional strength.     Time 8   Period Weeks   Status New     PT LONG TERM GOAL #6   Title Pt will ambulate up to 500' over unlevel paved outdoor surfaces (including curb/ramp) with LRAD at mod I level in order to indicate safe return to community mobility.     Time 4   Period Weeks   Status New               Plan - 03/09/16 1435    Clinical Impression Statement Skilled session focused on assesment of BERG for LTGs.  Note 7 point improvement from baseline, almost meeting LTG.  Also met goal for HEP and mod I up to 500'.  Pt making slow but steady progress.  Feel that she could benefit from 4 more weeks of skilled therapy.     Rehab Potential Good   Clinical Impairments Affecting Rehab Potential progressive nature of neuropathy   PT Frequency 1x / week  then 2x/wk for 2 weeks   PT Duration 6 weeks  then 2x/wk for 2 weeks   PT Treatment/Interventions ADLs/Self Care Home Management;DME Instruction;Gait training;Stair training;Functional mobility training;Therapeutic activities;Therapeutic exercise;Balance training;Neuromuscular re-education;Orthotic  Fit/Training;Patient/family education;Energy conservation;Vestibular   PT Next Visit Plan check goals and extend POC.  add balance to HEP, continue to work on functional strength and balance with decreasing UE support.    Consulted and Agree with Plan of Care Patient;Family member/caregiver   Family Member Consulted husband      Patient will benefit from skilled therapeutic intervention in order to improve the following deficits and impairments:  Abnormal gait, Decreased activity tolerance, Decreased balance, Decreased knowledge of use of DME, Decreased mobility, Decreased strength, Impaired perceived functional ability, Impaired flexibility, Improper body mechanics, Postural dysfunction, Impaired sensation  Visit Diagnosis: Unsteadiness on feet  Muscle weakness (generalized)  Other abnormalities of gait and mobility  Other disturbances of skin sensation     Problem List There are no active problems to display for this patient.   Cameron Sprang, PT, MPT [facility] [address] [place], [place], [address] Phone: [phone]   Fax:  [phone] 03/09/16, 2:43 PM  Name: Emmerson Shuffield MRN: [id number] Date of Birth: 03/09/1940

## 2016-03-13 ENCOUNTER — Ambulatory Visit: Payer: PPO | Attending: Diagnostic Neuroimaging | Admitting: Rehabilitation

## 2016-03-13 ENCOUNTER — Encounter: Payer: Self-pay | Admitting: Rehabilitation

## 2016-03-13 DIAGNOSIS — R2689 Other abnormalities of gait and mobility: Secondary | ICD-10-CM

## 2016-03-13 DIAGNOSIS — R208 Other disturbances of skin sensation: Secondary | ICD-10-CM | POA: Insufficient documentation

## 2016-03-13 DIAGNOSIS — R2681 Unsteadiness on feet: Secondary | ICD-10-CM | POA: Insufficient documentation

## 2016-03-13 DIAGNOSIS — M6281 Muscle weakness (generalized): Secondary | ICD-10-CM

## 2016-03-13 NOTE — Patient Instructions (Signed)
Weight Shift: Anterior / Posterior (Righting / Equilibrium)    BEGIN WITH BACK LEANING AGAINST THE WALL AND FEET 3-4 INCHES AWAY. Move your hips off the wall and come to upright standing.  Hold for 5 seconds. Return slowly to the wall letting your hips bump the wall and return to stand.   Hold each position _5___ seconds. Use a chair or your walker in front of you.  Repeat _10__ times per session. Do _1__ sessions per day.   Tandem Walking    Walk along the counter top with Tonye Royalty on the other side.  Walk with each foot directly in front of other, heel of one foot touching toes of other foot with each step. Both feet straight ahead.  Repeat x 3 reps down and back along counter top.    "I love a Parade" Lift    March as high and as slow as you can along the counter top forwards and then backwards, repeat x 3 reps with Tonye Royalty on the other side of you.       Copyright  VHI. All rights reserved.

## 2016-03-13 NOTE — Therapy (Signed)
[facility] [address] [place], [place], [address] Phone: [phone]   Fax:  [phone]  Physical Therapy Treatment  Patient Details  Name: Nina Rush MRN: [id number] Date of Birth: April 21, 1940 Referring Provider: Andrey Spearman  Encounter Date: 03/13/2016      PT End of Session - 03/13/16 1410    Visit Number 8   Number of Visits 19   Date for PT Re-Evaluation 05/12/16  updated POC for 60 days, only to see for 4 weeks planned   Authorization Type MCR- Gcode on every 10th visit   PT Start Time 1155  pt late to appt   PT Stop Time 1235   PT Time Calculation (min) 40 min   Activity Tolerance Patient tolerated treatment well   Behavior During Therapy [facility] for tasks assessed/performed      Past Medical History:  Diagnosis Date  . Charcot-Marie-Tooth disease   . Fuchs' adenoma    bilateral eyes  . Hypertension   . Lichen planus    scalp    Past Surgical History:  Procedure Laterality Date  . ABDOMINAL HYSTERECTOMY     total  . APPENDECTOMY    . CATARACT EXTRACTION Bilateral   . CHOLECYSTECTOMY    . LEG SURGERY     as child    There were no vitals filed for this visit.      Subjective Assessment - 03/13/16 1156    Subjective Pt reports that she feels balance is slowly getting better.    Patient is accompained by: Family member   Limitations House hold activities;Walking   Patient Stated Goals "I want to make sure I have the right kind of support for my feet since I have foot drop."     Currently in Pain? No/denies                         [facility] Adult PT Treatment/Exercise - 03/13/16 0001      Self-Care   Self-Care Other Self-Care Comments   Other Self-Care Comments  PT continue to discuss ending therapy following this renewal and importance of community fitness, chair yoga, and continued work on HEP at home for continued progress past therapy.  Assisted with some scheduling  needs, but directed them to front desk for remainder of time.       Neuro Re-ed    Neuro Re-ed Details  step ups in // bars with stance leg on foam pad on 4" step advancing opposite leg from ground to 6" step and back with light BUE support for strength and balance x 10 reps each side.  Static and dynamic balance in // bars with single UE support>no UE support for up to 5 secs.  Dynamic standing weight shifting laterally with intermittent light UE support x 2 sets of 10 reps.  Added balance to HEP, see pt instruction for details on exercises performed and reps.                  PT Education - 03/13/16 1410    Education provided Yes   Education Details see self care   Person(s) Educated Patient;Spouse   Methods Demonstration;Explanation;Handout   Comprehension Verbalized understanding;Returned demonstration;Need further instruction          PT Short Term Goals - 02/14/16 1214      PT SHORT TERM GOAL #1   Title Pt will initiate HEP in order to improve BLE strength and functional mobility.  (Target  Date: 02/06/16)   Baseline pt doing intermittently at home.    Time 4   Period Weeks   Status Partially Met     PT SHORT TERM GOAL #2   Title Will assess BERG score and improve score by 4 points in order to indicate decreased fall risk.     Baseline 9/56 on 01/29/16   Time 4   Period Weeks   Status Achieved     PT SHORT TERM GOAL #3   Title Pt will improve gait speed to 1.66 ft/sec w/ LRAD in order to indicate improved efficiency of gait.     Baseline 1.84 ft/sec with rollator on 02/14/16   Time 4   Period Weeks   Status Achieved     PT SHORT TERM GOAL #4   Title Will have formal assessment of BLE bracing in order to reduce fall risk.     Baseline met    Time 4   Period Weeks   Status Achieved     PT SHORT TERM GOAL #5   Title Pt will negotiate up/down 14 steps with single rail at mod I level in order to indicate safety when negotiating to bonus room at home.    Time 4    Period Weeks   Status Not Met     PT SHORT TERM GOAL #6   Title Will assess 5TSS in order to better assess functional BLE strength.     Baseline 15.34 secs with BUE support on 02/14/16   Time 4   Period Weeks   Status Achieved           PT Long Term Goals - 03/09/16 1159      PT LONG TERM GOAL #1   Title Pt will be independent with HEP in order to indicate improved functional mobility and decreased fall risk.  (Target Date: 03/06/15)   Baseline met 03/09/16   Time 8   Period Weeks   Status Achieved     PT LONG TERM GOAL #2   Title Pt will improve BERG balance test to 20/56 in order to indicate decreased fall risk.     Baseline 16/56, 7 point improvement on 03/09/16   Time 8   Period Weeks   Status Partially Met  change to revised next visit     PT LONG TERM GOAL #3   Title Pt will ambulate 500' w/ LRAD and appropriate bracing for BLEs at mod I level in order to indicate safe return to community mobility.     Baseline met 03/09/16   Time 8   Period Weeks   Status Achieved     PT LONG TERM GOAL #4   Title Pt will improve gait speed to 2.26 ft/sec w/ LRAD in order to indicate improved efficiency of gait and decreased fall risk.     Time 8   Period Weeks   Status On-going     PT LONG TERM GOAL #5   Title Pt will perform 5TSS test <20 seconds without UE support in order to indicate improved functional strength.     Time 8   Period Weeks   Status New     PT LONG TERM GOAL #6   Title Pt will ambulate up to 500' over unlevel paved outdoor surfaces (including curb/ramp) with LRAD at mod I level in order to indicate safe return to community mobility.     Time 4   Period Weeks   Status New  Plan - 03/13/16 1411    Clinical Impression Statement Skilled session continues to focus on static and dynamic balance with BLE strengthening.  Added balance to HEP , see pt instruction.    Rehab Potential Good   Clinical Impairments Affecting Rehab Potential  progressive nature of neuropathy   PT Frequency 2x / week   PT Duration 4 weeks   PT Treatment/Interventions ADLs/Self Care Home Management;DME Instruction;Gait training;Stair training;Functional mobility training;Therapeutic activities;Therapeutic exercise;Balance training;Neuromuscular re-education;Orthotic Fit/Training;Patient/family education;Energy conservation;Vestibular   PT Next Visit Plan  add balance to HEP, continue to work on functional strength and balance with decreasing UE support.    Consulted and Agree with Plan of Care Patient;Family member/caregiver   Family Member Consulted husband      Patient will benefit from skilled therapeutic intervention in order to improve the following deficits and impairments:  Abnormal gait, Decreased activity tolerance, Decreased balance, Decreased knowledge of use of DME, Decreased mobility, Decreased strength, Impaired perceived functional ability, Impaired flexibility, Improper body mechanics, Postural dysfunction, Impaired sensation  Visit Diagnosis: Unsteadiness on feet  Muscle weakness (generalized)  Other abnormalities of gait and mobility  Other disturbances of skin sensation     Problem List There are no active problems to display for this patient. Cameron Sprang, PT, MPT [facility] [address] [place], [place], [address] Phone: [phone]   Fax:  [phone] 03/13/16, 2:15 PM  Name: Nina Rush MRN: [id number] Date of Birth: October 11, 1940

## 2016-03-16 ENCOUNTER — Ambulatory Visit: Payer: PPO | Admitting: Rehabilitation

## 2016-03-16 DIAGNOSIS — R208 Other disturbances of skin sensation: Secondary | ICD-10-CM

## 2016-03-16 DIAGNOSIS — R2681 Unsteadiness on feet: Secondary | ICD-10-CM

## 2016-03-16 DIAGNOSIS — R2689 Other abnormalities of gait and mobility: Secondary | ICD-10-CM

## 2016-03-16 DIAGNOSIS — M6281 Muscle weakness (generalized): Secondary | ICD-10-CM

## 2016-03-16 NOTE — Patient Instructions (Signed)
Feet Apart (Compliant Surface) Arm Motion - Eyes Open    With eyes open, standing on compliant surface: __foam cushion______, feet shoulder width apart, keep arms by your side.  Repeat __5__ times per session for 15 secs each. Do __1__ sessions per day.  Copyright  VHI. All rights reserved.

## 2016-03-16 NOTE — Therapy (Signed)
[facility] [address] [place], [place], [address] Phone: [phone]   Fax:  [phone]  Physical Therapy Treatment  Patient Details  Name: Nina Rush MRN: [id number] Date of Birth: November 11, 1940 Referring Provider: Andrey Spearman  Encounter Date: 03/16/2016      PT End of Session - 03/16/16 2031    Visit Number 9   Number of Visits 19   Date for PT Re-Evaluation 05/12/16  updated POC for 60 days, only to see for 4 weeks planned   Authorization Type MCR- Gcode on every 10th visit   PT Start Time 1151   PT Stop Time 1231   PT Time Calculation (min) 40 min   Activity Tolerance Patient tolerated treatment well   Behavior During Therapy [facility] for tasks assessed/performed      Past Medical History:  Diagnosis Date  . Charcot-Marie-Tooth disease   . Fuchs' adenoma    bilateral eyes  . Hypertension   . Lichen planus    scalp    Past Surgical History:  Procedure Laterality Date  . ABDOMINAL HYSTERECTOMY     total  . APPENDECTOMY    . CATARACT EXTRACTION Bilateral   . CHOLECYSTECTOMY    . LEG SURGERY     as child    There were no vitals filed for this visit.      Subjective Assessment - 03/16/16 1200    Subjective "I want to go over some of these exercises."    Patient is accompained by: Family member   Limitations House hold activities;Walking   Patient Stated Goals "I want to make sure I have the right kind of support for my feet since I have foot drop."     Currently in Pain? No/denies                         [facility] Adult PT Treatment/Exercise - 03/16/16 1225      Self-Care   Self-Care Other Self-Care Comments   Other Self-Care Comments  Briefly went over schedule again with pt, agreeable to keep later 8 am session with discussion of D/C the first week in March.      Neuro Re-ed    Neuro Re-ed Details  quadruped alternating UEs x 5 reps, alternating LEs only x 2 sets of 3 reps.   Seated physioball maintaining upright posture while performing seated marching x 2 sets of 5 reps, seated ball LAQs x 2 sets of 5 reps.   Pt brought in a cushion she had at home to assess if she could use this at hom with intermittent UE support.  Note that it is appropriate thickness and provided single exercise to perform on foam cushion, see pt instruction.       Exercises   Exercises Other Exercises   Other Exercises  Performed standing hip extension x 10 reps on each side and standing knee flex x 10 reps BLE per pt request as on HEP, hamstring pulls seated on rolling stool x 2 reps of 30' as well as pushing backwards for quad strength, tolerated well with intermittent rest breaks as needed.                  PT Education - 03/16/16 2031    Education provided Yes   Education Details additional exercise for balance   Person(s) Educated Patient;Spouse   Methods Explanation;Demonstration;Handout   Comprehension Verbalized understanding;Returned demonstration          PT  Short Term Goals - 02/14/16 1214      PT SHORT TERM GOAL #1   Title Pt will initiate HEP in order to improve BLE strength and functional mobility.  (Target Date: 02/06/16)   Baseline pt doing intermittently at home.    Time 4   Period Weeks   Status Partially Met     PT SHORT TERM GOAL #2   Title Will assess BERG score and improve score by 4 points in order to indicate decreased fall risk.     Baseline 9/56 on 01/29/16   Time 4   Period Weeks   Status Achieved     PT SHORT TERM GOAL #3   Title Pt will improve gait speed to 1.66 ft/sec w/ LRAD in order to indicate improved efficiency of gait.     Baseline 1.84 ft/sec with rollator on 02/14/16   Time 4   Period Weeks   Status Achieved     PT SHORT TERM GOAL #4   Title Will have formal assessment of BLE bracing in order to reduce fall risk.     Baseline met    Time 4   Period Weeks   Status Achieved     PT SHORT TERM GOAL #5   Title Pt will  negotiate up/down 14 steps with single rail at mod I level in order to indicate safety when negotiating to bonus room at home.    Time 4   Period Weeks   Status Not Met     PT SHORT TERM GOAL #6   Title Will assess 5TSS in order to better assess functional BLE strength.     Baseline 15.34 secs with BUE support on 02/14/16   Time 4   Period Weeks   Status Achieved           PT Long Term Goals - 03/09/16 1159      PT LONG TERM GOAL #1   Title Pt will be independent with HEP in order to indicate improved functional mobility and decreased fall risk.  (Target Date: 03/06/15)   Baseline met 03/09/16   Time 8   Period Weeks   Status Achieved     PT LONG TERM GOAL #2   Title Pt will improve BERG balance test to 20/56 in order to indicate decreased fall risk.     Baseline 16/56, 7 point improvement on 03/09/16   Time 8   Period Weeks   Status Partially Met  change to revised next visit     PT LONG TERM GOAL #3   Title Pt will ambulate 500' w/ LRAD and appropriate bracing for BLEs at mod I level in order to indicate safe return to community mobility.     Baseline met 03/09/16   Time 8   Period Weeks   Status Achieved     PT LONG TERM GOAL #4   Title Pt will improve gait speed to 2.26 ft/sec w/ LRAD in order to indicate improved efficiency of gait and decreased fall risk.     Time 8   Period Weeks   Status On-going     PT LONG TERM GOAL #5   Title Pt will perform 5TSS test <20 seconds without UE support in order to indicate improved functional strength.     Time 8   Period Weeks   Status New     PT LONG TERM GOAL #6   Title Pt will ambulate up to 500' over unlevel paved outdoor surfaces (including curb/ramp) with  LRAD at mod I level in order to indicate safe return to community mobility.     Time 4   Period Weeks   Status New               Plan - 03/16/16 2032    Clinical Impression Statement Skilled session continues to focus on BLE strengthening, core  strengthening and balance.     Rehab Potential Good   Clinical Impairments Affecting Rehab Potential progressive nature of neuropathy   PT Frequency 2x / week   PT Duration 4 weeks   PT Treatment/Interventions ADLs/Self Care Home Management;DME Instruction;Gait training;Stair training;Functional mobility training;Therapeutic activities;Therapeutic exercise;Balance training;Neuromuscular re-education;Orthotic Fit/Training;Patient/family education;Energy conservation;Vestibular   PT Next Visit Plan GCODE/PN, balance, functional strength, standing balance with varying amounts of support.    Consulted and Agree with Plan of Care Patient;Family member/caregiver   Family Member Consulted husband      Patient will benefit from skilled therapeutic intervention in order to improve the following deficits and impairments:  Abnormal gait, Decreased activity tolerance, Decreased balance, Decreased knowledge of use of DME, Decreased mobility, Decreased strength, Impaired perceived functional ability, Impaired flexibility, Improper body mechanics, Postural dysfunction, Impaired sensation  Visit Diagnosis: Unsteadiness on feet  Muscle weakness (generalized)  Other abnormalities of gait and mobility  Other disturbances of skin sensation     Problem List There are no active problems to display for this patient.   Cameron Sprang, PT, MPT [facility] [address] [place], [place], [address] Phone: [phone]   Fax:  [phone] 03/16/16, 8:36 PM  Name: Nina Rush MRN: [id number] Date of Birth: Feb 11, 1940

## 2016-03-20 ENCOUNTER — Encounter: Payer: Self-pay | Admitting: Rehabilitation

## 2016-03-20 ENCOUNTER — Ambulatory Visit: Payer: PPO | Admitting: Rehabilitation

## 2016-03-20 DIAGNOSIS — R208 Other disturbances of skin sensation: Secondary | ICD-10-CM

## 2016-03-20 DIAGNOSIS — M6281 Muscle weakness (generalized): Secondary | ICD-10-CM

## 2016-03-20 DIAGNOSIS — R2681 Unsteadiness on feet: Secondary | ICD-10-CM | POA: Diagnosis not present

## 2016-03-20 DIAGNOSIS — R2689 Other abnormalities of gait and mobility: Secondary | ICD-10-CM

## 2016-03-20 NOTE — Therapy (Signed)
[facility] [address] [place], [place], [address] Phone: [phone]   Fax:  [phone]  Physical Therapy Treatment  Patient Details  Name: Nina Rush MRN: [id number] Date of Birth: 01-14-1941 Referring Provider: Joycelyn Schmid  Encounter Date: 03/20/2016      PT End of Session - 03/20/16 1200    Visit Number 10   Number of Visits 19   Date for PT Re-Evaluation 05/12/16  updated POC for 60 days, only to see for 4 weeks planned   Authorization Type MCR- Gcode on every 10th visit   PT Start Time 1158   PT Stop Time 1235   PT Time Calculation (min) 37 min   Activity Tolerance Patient tolerated treatment well   Behavior During Therapy [facility] for tasks assessed/performed      Past Medical History:  Diagnosis Date  . Charcot-Marie-Tooth disease   . Fuchs' adenoma    bilateral eyes  . Hypertension   . Lichen planus    scalp    Past Surgical History:  Procedure Laterality Date  . ABDOMINAL HYSTERECTOMY     total  . APPENDECTOMY    . CATARACT EXTRACTION Bilateral   . CHOLECYSTECTOMY    . LEG SURGERY     as child    There were no vitals filed for this visit.      Subjective Assessment - 03/20/16 1200    Subjective "I feel like I'm getting better."    Patient is accompained by: Family member   Limitations House hold activities;Walking   Patient Stated Goals "I want to make sure I have the right kind of support for my feet since I have foot drop."     Currently in Pain? No/denies                         [facility] Adult PT Treatment/Exercise - 03/20/16 0001      Exercises   Exercises Other Exercises   Other Exercises  SL clam for hip abd x 10 reps with red theraband, BLE bridging with hip abd using red band x 10 reps, prone hamstring curls without weight x 10 reps (did provide this for home), tall kneeling squats without UE support to increase LE activation x 10 reps, quadruped "cat/camel"  stretch x 5 reps with 5 sec holds, "child's pose" x 1 rep for 30 secs..  Continue to work on sit<>stand with single UE support.   Performed x 10 reps with emphasis on slow controlled movement.   While in tall kneeling had pt prop forearms on physioball moving ball outward and back in for improved core activation.  tolerated well with cues for posture to decrease back pain.                 PT Education - 03/20/16 1235    Education provided Yes   Education Details additional prone exercise   Person(s) Educated Patient;Spouse   Methods Explanation;Demonstration;Handout   Comprehension Verbalized understanding          PT Short Term Goals - 02/14/16 1214      PT SHORT TERM GOAL #1   Title Pt will initiate HEP in order to improve BLE strength and functional mobility.  (Target Date: 02/06/16)   Baseline pt doing intermittently at home.    Time 4   Period Weeks   Status Partially Met     PT SHORT TERM GOAL #2   Title Will assess BERG score and  improve score by 4 points in order to indicate decreased fall risk.     Baseline 9/56 on 01/29/16   Time 4   Period Weeks   Status Achieved     PT SHORT TERM GOAL #3   Title Pt will improve gait speed to 1.66 ft/sec w/ LRAD in order to indicate improved efficiency of gait.     Baseline 1.84 ft/sec with rollator on 02/14/16   Time 4   Period Weeks   Status Achieved     PT SHORT TERM GOAL #4   Title Will have formal assessment of BLE bracing in order to reduce fall risk.     Baseline met    Time 4   Period Weeks   Status Achieved     PT SHORT TERM GOAL #5   Title Pt will negotiate up/down 14 steps with single rail at mod I level in order to indicate safety when negotiating to bonus room at home.    Time 4   Period Weeks   Status Not Met     PT SHORT TERM GOAL #6   Title Will assess 5TSS in order to better assess functional BLE strength.     Baseline 15.34 secs with BUE support on 02/14/16   Time 4   Period Weeks   Status  Achieved           PT Long Term Goals - 03/09/16 1159      PT LONG TERM GOAL #1   Title Pt will be independent with HEP in order to indicate improved functional mobility and decreased fall risk.  (Target Date: 03/06/15)   Baseline met 03/09/16   Time 8   Period Weeks   Status Achieved     PT LONG TERM GOAL #2   Title Pt will improve BERG balance test to 20/56 in order to indicate decreased fall risk.     Baseline 16/56, 7 point improvement on 03/09/16   Time 8   Period Weeks   Status Partially Met  change to revised next visit     PT LONG TERM GOAL #3   Title Pt will ambulate 500' w/ LRAD and appropriate bracing for BLEs at mod I level in order to indicate safe return to community mobility.     Baseline met 03/09/16   Time 8   Period Weeks   Status Achieved     PT LONG TERM GOAL #4   Title Pt will improve gait speed to 2.26 ft/sec w/ LRAD in order to indicate improved efficiency of gait and decreased fall risk.     Time 8   Period Weeks   Status On-going     PT LONG TERM GOAL #5   Title Pt will perform 5TSS test <20 seconds without UE support in order to indicate improved functional strength.     Time 8   Period Weeks   Status New     PT LONG TERM GOAL #6   Title Pt will ambulate up to 500' over unlevel paved outdoor surfaces (including curb/ramp) with LRAD at mod I level in order to indicate safe return to community mobility.     Time 4   Period Weeks   Status New               Plan - 03/20/16 1303    Clinical Impression Statement Skilled session focused on BLE strength, core strengthening and balance along with stretches for lower back.  Tolerated well.  Rehab Potential Good   Clinical Impairments Affecting Rehab Potential progressive nature of neuropathy   PT Frequency 2x / week   PT Duration 4 weeks   PT Treatment/Interventions ADLs/Self Care Home Management;DME Instruction;Gait training;Stair training;Functional mobility training;Therapeutic  activities;Therapeutic exercise;Balance training;Neuromuscular re-education;Orthotic Fit/Training;Patient/family education;Energy conservation;Vestibular   PT Next Visit Plan  balance, functional strength, standing balance with varying amounts of support.    Consulted and Agree with Plan of Care Patient;Family member/caregiver   Family Member Consulted husband      Patient will benefit from skilled therapeutic intervention in order to improve the following deficits and impairments:  Abnormal gait, Decreased activity tolerance, Decreased balance, Decreased knowledge of use of DME, Decreased mobility, Decreased strength, Impaired perceived functional ability, Impaired flexibility, Improper body mechanics, Postural dysfunction, Impaired sensation  Visit Diagnosis: Unsteadiness on feet  Muscle weakness (generalized)  Other abnormalities of gait and mobility  Other disturbances of skin sensation       G-Codes - 04/18/16 1304    Functional Assessment Tool Used BERG: 16/56 on 03/09/16   Functional Limitation Mobility: Walking and moving around   Mobility: Walking and Moving Around Current Status [phone]) At least 60 percent but less than 80 percent impaired, limited or restricted   Mobility: Walking and Moving Around Goal Status [phone]) At least 60 percent but less than 80 percent impaired, limited or restricted  updated to be within BERG status, note improvement but same level      Physical Therapy Progress Note  Dates of Reporting Period: 01/09/16 to Apr 18, 2016  Objective Reports of Subjective Statement: see above  Objective Measurements: See above  Goal Update: see above  Plan: Continue POC  Reason Skilled Services are Required: Continues to have significant balance and strength deficits related to neuropathy.       Problem List There are no active problems to display for this patient.   Cameron Sprang, PT, MPT [facility] [address]  [place], [place], [address] Phone: [phone]   Fax:  [phone] 04/18/2016, 3:14 PM  Name: Nina Rush MRN: [id number] Date of Birth: 1940/08/21

## 2016-03-20 NOTE — Patient Instructions (Signed)
Hamstrings    Lie on stomach.  Bend same knee __90__ degrees, pointing toes toward knee. Do not bend hips or rotate trunk.  Hold __5__ seconds. Repeat __10__ times. Do _1___ sessions per day. CAUTION: Move slowly.  Copyright  VHI. All rights reserved.

## 2016-03-23 ENCOUNTER — Encounter: Payer: Self-pay | Admitting: Rehabilitation

## 2016-03-23 ENCOUNTER — Ambulatory Visit: Payer: PPO | Admitting: Rehabilitation

## 2016-03-23 DIAGNOSIS — R2681 Unsteadiness on feet: Secondary | ICD-10-CM

## 2016-03-23 DIAGNOSIS — R208 Other disturbances of skin sensation: Secondary | ICD-10-CM

## 2016-03-23 DIAGNOSIS — M6281 Muscle weakness (generalized): Secondary | ICD-10-CM

## 2016-03-23 DIAGNOSIS — R2689 Other abnormalities of gait and mobility: Secondary | ICD-10-CM

## 2016-03-23 NOTE — Therapy (Signed)
[facility] [address] [place], [place], [address] Phone: [phone]   Fax:  [phone]  Physical Therapy Treatment  Patient Details  Name: Nina Rush MRN: [id number] Date of Birth: 08/26/1940 Referring Provider: Joycelyn Schmid  Encounter Date: 03/23/2016      PT End of Session - 03/23/16 1117    Visit Number 11   Number of Visits 19   Date for PT Re-Evaluation 05/12/16  updated POC for 60 days, only to see for 4 weeks planned   Authorization Type MCR- Gcode on every 10th visit   PT Start Time 1113  pt late to session then needing to use restroom   PT Stop Time 1147   PT Time Calculation (min) 34 min   Activity Tolerance Patient tolerated treatment well   Behavior During Therapy [facility] for tasks assessed/performed      Past Medical History:  Diagnosis Date  . Charcot-Marie-Tooth disease   . Fuchs' adenoma    bilateral eyes  . Hypertension   . Lichen planus    scalp    Past Surgical History:  Procedure Laterality Date  . ABDOMINAL HYSTERECTOMY     total  . APPENDECTOMY    . CATARACT EXTRACTION Bilateral   . CHOLECYSTECTOMY    . LEG SURGERY     as child    There were no vitals filed for this visit.      Subjective Assessment - 03/23/16 1116    Subjective Exercises are going well, no pain to report.    Patient is accompained by: Family member   Limitations House hold activities;Walking   Patient Stated Goals "I want to make sure I have the right kind of support for my feet since I have foot drop."     Currently in Pain? No/denies                         [facility] Adult PT Treatment/Exercise - 03/23/16 1131      Neuro Re-ed    Neuro Re-ed Details  Forwards/backwards weight shift with feet staggered and single UE support x 10 reps on each side.  Lateral weight shifts with side step and reaching upward x 10 reps on each side., then with very little single UE support not stepping  but shifting side to side and reaching upward again x 10 reps each direction.  Transitioned to // bars on small rocker board in order to work on improved hip strategy with forward/backward biased and board laterally biased.   Had pt maintain balance x 2 reps on 2 min each with intermittent single UE support, but do note improvement in balance and hip strategy.  Ended with wall bumps with feet approx 3" from wall maintaining each position x 5 seconds with intermittent single UE support once off of wall.  Tolerated well.       Exercises   Exercises Other Exercises   Other Exercises  sit<>stand x 10 reps with single UE support only again to continue to emphasize forward trunk lean and forward weight shift along with controlled movement.                  PT Education - 03/23/16 1117    Education provided --   [employer]) Educated --   Methods --   Comprehension --          PT Short Term Goals - 02/14/16 1214      PT SHORT TERM GOAL #1  Title Pt will initiate HEP in order to improve BLE strength and functional mobility.  (Target Date: 02/06/16)   Baseline pt doing intermittently at home.    Time 4   Period Weeks   Status Partially Met     PT SHORT TERM GOAL #2   Title Will assess BERG score and improve score by 4 points in order to indicate decreased fall risk.     Baseline 9/56 on 01/29/16   Time 4   Period Weeks   Status Achieved     PT SHORT TERM GOAL #3   Title Pt will improve gait speed to 1.66 ft/sec w/ LRAD in order to indicate improved efficiency of gait.     Baseline 1.84 ft/sec with rollator on 02/14/16   Time 4   Period Weeks   Status Achieved     PT SHORT TERM GOAL #4   Title Will have formal assessment of BLE bracing in order to reduce fall risk.     Baseline met    Time 4   Period Weeks   Status Achieved     PT SHORT TERM GOAL #5   Title Pt will negotiate up/down 14 steps with single rail at mod I level in order to indicate safety when negotiating to  bonus room at home.    Time 4   Period Weeks   Status Not Met     PT SHORT TERM GOAL #6   Title Will assess 5TSS in order to better assess functional BLE strength.     Baseline 15.34 secs with BUE support on 02/14/16   Time 4   Period Weeks   Status Achieved           PT Long Term Goals - 03/09/16 1159      PT LONG TERM GOAL #1   Title Pt will be independent with HEP in order to indicate improved functional mobility and decreased fall risk.  (Target Date: 03/06/15)   Baseline met 03/09/16   Time 8   Period Weeks   Status Achieved     PT LONG TERM GOAL #2   Title Pt will improve BERG balance test to 20/56 in order to indicate decreased fall risk.     Baseline 16/56, 7 point improvement on 03/09/16   Time 8   Period Weeks   Status Partially Met  change to revised next visit     PT LONG TERM GOAL #3   Title Pt will ambulate 500' w/ LRAD and appropriate bracing for BLEs at mod I level in order to indicate safe return to community mobility.     Baseline met 03/09/16   Time 8   Period Weeks   Status Achieved     PT LONG TERM GOAL #4   Title Pt will improve gait speed to 2.26 ft/sec w/ LRAD in order to indicate improved efficiency of gait and decreased fall risk.     Time 8   Period Weeks   Status On-going     PT LONG TERM GOAL #5   Title Pt will perform 5TSS test <20 seconds without UE support in order to indicate improved functional strength.     Time 8   Period Weeks   Status New     PT LONG TERM GOAL #6   Title Pt will ambulate up to 500' over unlevel paved outdoor surfaces (including curb/ramp) with LRAD at mod I level in order to indicate safe return to community mobility.  Time 4   Period Weeks   Status New               Plan - 03/23/16 1308    Clinical Impression Statement Skilled session focus on improving balance with little UE support during lateral, forwards and backwards weight shifting and elicitation of hip strategy.     Rehab Potential Good    Clinical Impairments Affecting Rehab Potential progressive nature of neuropathy   PT Frequency 2x / week   PT Duration 4 weeks   PT Treatment/Interventions ADLs/Self Care Home Management;DME Instruction;Gait training;Stair training;Functional mobility training;Therapeutic activities;Therapeutic exercise;Balance training;Neuromuscular re-education;Orthotic Fit/Training;Patient/family education;Energy conservation;Vestibular   PT Next Visit Plan  balance, functional strength, standing balance with varying amounts of support.    Consulted and Agree with Plan of Care Patient;Family member/caregiver   Family Member Consulted husband      Patient will benefit from skilled therapeutic intervention in order to improve the following deficits and impairments:  Abnormal gait, Decreased activity tolerance, Decreased balance, Decreased knowledge of use of DME, Decreased mobility, Decreased strength, Impaired perceived functional ability, Impaired flexibility, Improper body mechanics, Postural dysfunction, Impaired sensation  Visit Diagnosis: Unsteadiness on feet  Muscle weakness (generalized)  Other abnormalities of gait and mobility  Other disturbances of skin sensation     Problem List There are no active problems to display for this patient.   Cameron Sprang, PT, MPT [facility] [address] [place], [place], [address] Phone: [phone]   Fax:  [phone] 03/23/16, 1:09 PM  Name: Nina Rush MRN: [id number] Date of Birth: 1940/11/08

## 2016-03-27 ENCOUNTER — Ambulatory Visit: Payer: PPO | Admitting: Rehabilitation

## 2016-03-27 ENCOUNTER — Encounter: Payer: Self-pay | Admitting: Rehabilitation

## 2016-03-27 DIAGNOSIS — M6281 Muscle weakness (generalized): Secondary | ICD-10-CM

## 2016-03-27 DIAGNOSIS — R2681 Unsteadiness on feet: Secondary | ICD-10-CM | POA: Diagnosis not present

## 2016-03-27 DIAGNOSIS — R208 Other disturbances of skin sensation: Secondary | ICD-10-CM

## 2016-03-27 DIAGNOSIS — R2689 Other abnormalities of gait and mobility: Secondary | ICD-10-CM

## 2016-03-27 NOTE — Patient Instructions (Addendum)
Abduction    Lift leg up toward ceiling. Return. Make sure hip stays perpendicular to the ceiling.  Think about aiming leg upward and slightly backwards.  Repeat __10__ times each leg. Do __1__ sessions per day.  This can replace the clam exercise.   http://gt2.exer.us/386   Copyright  VHI. All rights reserved.    Butterfly, Sitting With Flexion    Grasp feet with hands and bend from hips. Gently pull forward, elbows pushing outward. Hold _60__ seconds.  Just do one side at a time and leave the other leg straight until you increase your flexibility.   Repeat _2-3__ times per session. Do _1__ sessions per day.  Copyright  VHI. All rights reserved.

## 2016-03-27 NOTE — Therapy (Signed)
[facility] [address] [place], [place], [address] Phone: [phone]   Fax:  [phone]  Physical Therapy Treatment  Patient Details  Name: Nina Rush MRN: [id number] Date of Birth: 04/11/1940 Referring Provider: Andrey Spearman  Encounter Date: 03/27/2016      PT End of Session - 03/27/16 1507    Visit Number 12   Number of Visits 19   Date for PT Re-Evaluation 05/12/16  updated POC for 60 days, only to see for 4 weeks planned   Authorization Type MCR- Gcode on every 10th visit   PT Start Time 1115  pt late to appt   PT Stop Time 1146   PT Time Calculation (min) 31 min   Activity Tolerance Patient tolerated treatment well   Behavior During Therapy [facility] for tasks assessed/performed      Past Medical History:  Diagnosis Date  . Charcot-Marie-Tooth disease   . Fuchs' adenoma    bilateral eyes  . Hypertension   . Lichen planus    scalp    Past Surgical History:  Procedure Laterality Date  . ABDOMINAL HYSTERECTOMY     total  . APPENDECTOMY    . CATARACT EXTRACTION Bilateral   . CHOLECYSTECTOMY    . LEG SURGERY     as child    There were no vitals filed for this visit.      Subjective Assessment - 03/27/16 1502    Subjective "My back hurts a little today, maybe from the exercises."    Patient is accompained by: Family member   Limitations House hold activities;Walking   Patient Stated Goals "I want to make sure I have the right kind of support for my feet since I have foot drop."     Currently in Pain? Yes   Pain Score 3    Pain Location Back   Pain Orientation Right;Lower   Pain Descriptors / Indicators Aching   Pain Type Acute pain   Pain Onset Yesterday   Pain Frequency Intermittent   Aggravating Factors  exercises, certain ones   Pain Relieving Factors stretching, heat                         [facility] Adult PT Treatment/Exercise - 03/27/16 1117      Neuro Re-ed     Neuro Re-ed Details  Tandem walking x 1 rep of 12' with intermittent UE support as needed, side stepping with lunges x 2 reps of 12' with intermittent UE support with cues for technique and decreased arch in back for less pain.      Exercises   Exercises Other Exercises   Other Exercises  Child's pose with L lateral reach to increase stretch to R lower back x 1 min, SL hip abd with leg staight to increase difficulty x 10 reps on each side (replaced clam exercise on HEP), seated modified butterfly stretch with L leg straight and R leg bent to stretch adductors x 2 sets of 1 min, supine lower abdominal exercises with LE marching (keeping both feet off the mat the whole time) x 10 reps, 2 sets; supine elbow to opposite knee with cues for decreased strain in neck and to lift from abdomen x 10 reps on both sides.  Modified sit ups to incline with cues for breathing and min A to complete x 4 reps.                  PT Education -  03/27/16 1507    Education provided Yes   Education Details modifications to HEP   Person(s) Educated Patient;Spouse   Methods Explanation;Demonstration;Handout   Comprehension Returned demonstration;Verbalized understanding          PT Short Term Goals - 02/14/16 1214      PT SHORT TERM GOAL #1   Title Pt will initiate HEP in order to improve BLE strength and functional mobility.  (Target Date: 02/06/16)   Baseline pt doing intermittently at home.    Time 4   Period Weeks   Status Partially Met     PT SHORT TERM GOAL #2   Title Will assess BERG score and improve score by 4 points in order to indicate decreased fall risk.     Baseline 9/56 on 01/29/16   Time 4   Period Weeks   Status Achieved     PT SHORT TERM GOAL #3   Title Pt will improve gait speed to 1.66 ft/sec w/ LRAD in order to indicate improved efficiency of gait.     Baseline 1.84 ft/sec with rollator on 02/14/16   Time 4   Period Weeks   Status Achieved     PT SHORT TERM GOAL #4   Title  Will have formal assessment of BLE bracing in order to reduce fall risk.     Baseline met    Time 4   Period Weeks   Status Achieved     PT SHORT TERM GOAL #5   Title Pt will negotiate up/down 14 steps with single rail at mod I level in order to indicate safety when negotiating to bonus room at home.    Time 4   Period Weeks   Status Not Met     PT SHORT TERM GOAL #6   Title Will assess 5TSS in order to better assess functional BLE strength.     Baseline 15.34 secs with BUE support on 02/14/16   Time 4   Period Weeks   Status Achieved           PT Long Term Goals - 03/09/16 1159      PT LONG TERM GOAL #1   Title Pt will be independent with HEP in order to indicate improved functional mobility and decreased fall risk.  (Target Date: 03/06/15)   Baseline met 03/09/16   Time 8   Period Weeks   Status Achieved     PT LONG TERM GOAL #2   Title Pt will improve BERG balance test to 20/56 in order to indicate decreased fall risk.     Baseline 16/56, 7 point improvement on 03/09/16   Time 8   Period Weeks   Status Partially Met  change to revised next visit     PT LONG TERM GOAL #3   Title Pt will ambulate 500' w/ LRAD and appropriate bracing for BLEs at mod I level in order to indicate safe return to community mobility.     Baseline met 03/09/16   Time 8   Period Weeks   Status Achieved     PT LONG TERM GOAL #4   Title Pt will improve gait speed to 2.26 ft/sec w/ LRAD in order to indicate improved efficiency of gait and decreased fall risk.     Time 8   Period Weeks   Status On-going     PT LONG TERM GOAL #5   Title Pt will perform 5TSS test <20 seconds without UE support in order to indicate improved functional strength.  Time 8   Period Weeks   Status New     PT LONG TERM GOAL #6   Title Pt will ambulate up to 500' over unlevel paved outdoor surfaces (including curb/ramp) with LRAD at mod I level in order to indicate safe return to community mobility.     Time 4    Period Weeks   Status New               Plan - 03/27/16 1507    Clinical Impression Statement Skilled session focused on BLE strength, balance with light support and core strength/activation.  Pt tolerated well.     Rehab Potential Good   Clinical Impairments Affecting Rehab Potential progressive nature of neuropathy   PT Frequency 2x / week   PT Duration 4 weeks   PT Treatment/Interventions ADLs/Self Care Home Management;DME Instruction;Gait training;Stair training;Functional mobility training;Therapeutic activities;Therapeutic exercise;Balance training;Neuromuscular re-education;Orthotic Fit/Training;Patient/family education;Energy conservation;Vestibular   PT Next Visit Plan  balance, functional strength, standing balance with varying amounts of support. Begin to look for seated yoga exercises and pool exercises.    Consulted and Agree with Plan of Care Patient;Family member/caregiver   Family Member Consulted husband      Patient will benefit from skilled therapeutic intervention in order to improve the following deficits and impairments:  Abnormal gait, Decreased activity tolerance, Decreased balance, Decreased knowledge of use of DME, Decreased mobility, Decreased strength, Impaired perceived functional ability, Impaired flexibility, Improper body mechanics, Postural dysfunction, Impaired sensation  Visit Diagnosis: Unsteadiness on feet  Muscle weakness (generalized)  Other abnormalities of gait and mobility  Other disturbances of skin sensation     Problem List There are no active problems to display for this patient.   Cameron Sprang, PT, MPT [facility] [address] [place], [place], [address] Phone: [phone]   Fax:  [phone] 03/27/16, 3:09 PM  Name: Iyona Pehrson MRN: [id number] Date of Birth: [birth year]

## 2016-03-30 ENCOUNTER — Encounter: Payer: Self-pay | Admitting: Rehabilitation

## 2016-03-30 ENCOUNTER — Ambulatory Visit: Payer: PPO | Admitting: Rehabilitation

## 2016-03-30 DIAGNOSIS — R2681 Unsteadiness on feet: Secondary | ICD-10-CM

## 2016-03-30 DIAGNOSIS — R208 Other disturbances of skin sensation: Secondary | ICD-10-CM

## 2016-03-30 DIAGNOSIS — M6281 Muscle weakness (generalized): Secondary | ICD-10-CM

## 2016-03-30 DIAGNOSIS — R2689 Other abnormalities of gait and mobility: Secondary | ICD-10-CM

## 2016-03-30 NOTE — Patient Instructions (Addendum)
Abduction    Lift leg up toward ceiling. Return. Make sure hip stays perpendicular to the ceiling.  Think about aiming leg upward and slightly backwards.  Repeat __10__ times each leg. Do __1__ sessions per day.  This can replace the clam exercise.   http://gt2.exer.us/386   Copyright  VHI. All rights reserved.    Butterfly, Sitting With Flexion    Grasp feet with hands and bend from hips. Gently pull forward, elbows pushing outward. Hold _60__ seconds.  Just do one side at a time and leave the other leg straight until you increase your flexibility.   Repeat _2-3__ times per session. Do _1__ sessions per day.  Copyright  VHI. All rights reserved.   Hamstrings    Lie on stomach.  Bend same knee __90__ degrees, pointing toes toward knee. Do not bend hips or rotate trunk.  Hold __5__ seconds. Repeat __10__ times. Do _1___ sessions per day. CAUTION: Move slowly.  Copyright  VHI. All rights reserved.   Feet Apart (Compliant Surface) Arm Motion - Eyes Open    With eyes open, standing on compliant surface: __foam cushion______, feet shoulder width apart, keep arms by your side.  Repeat __5__ times per session for 15 secs each. Do __1__ sessions per day.  Copyright  VHI. All rights reserved.   Weight Shift: Anterior / Posterior (Righting / Equilibrium)    BEGIN WITH BACK LEANING AGAINST THE WALL AND FEET 3-4 INCHES AWAY. Move your hips off the wall and come to upright standing.  Hold for 5 seconds. Return slowly to the wall letting your hips bump the wall and return to stand.   Hold each position _5___ seconds. Use a chair or your walker in front of you.  Repeat _10__ times per session. Do _1__ sessions per day.   Tandem Walking    Walk along the counter top with Nina Rush on the other side.  Walk with each foot directly in front of other, heel of one foot touching toes of other foot with each step. Both feet straight ahead.  Repeat x 3 reps down and back along  counter top.    "I love a Parade" Lift    March as high and as slow as you can along the counter top forwards and then backwards, repeat x 3 reps with Nina Rush on the other side of you.       Copyright  VHI. All rights reserved.   ABDUCTION: Standing (Active)   Stand facing counter top for both hands to have support, feet flat. Lift right leg out to side.  Complete __10_ repetitions. Perform __2_ sessions per day.    EXTENSION: Standing (Active)  Stand facing counter top for both hands to have support, both feet flat. Draw right leg behind body as far as possible.  Complete 10 repetitions. Perform __2_ sessions per day.  Copyright  VHI. All rights reserved.     Use your step in the garage and still hang on with your hands but focus on using your legs more to boost you up onto the step.  Start with left foot leading each time x 10 reps and then lead with right leg x 10 reps.    Functional Quadriceps: Sit to Stand    Sit on edge of chair, feet flat on floor. Stand upright, extending knees fully.  Try to do without using your hands!! Repeat __10__ times per set. Do __1__ sets per session. Do __1-2__ sessions per day.

## 2016-03-30 NOTE — Therapy (Signed)
[facility] [address] [place], [place], [address] Phone: [phone]   Fax:  [phone]  Physical Therapy Treatment  Patient Details  Name: Nina Rush MRN: [id number] Date of Birth: 1940/07/26 Referring Provider: Andrey Spearman  Encounter Date: 03/30/2016      PT End of Session - 03/30/16 1134    Visit Number 13   Number of Visits 19   Date for PT Re-Evaluation 05/12/16  updated POC for 60 days, only to see for 4 weeks planned   Authorization Type MCR- Gcode on every 10th visit   PT Start Time 1025  pt late for session, used restroom   PT Stop Time 1110   PT Time Calculation (min) 45 min   Activity Tolerance Patient tolerated treatment well   Behavior During Therapy [facility] for tasks assessed/performed      Past Medical History:  Diagnosis Date  . Charcot-Marie-Tooth disease   . Fuchs' adenoma    bilateral eyes  . Hypertension   . Lichen planus    scalp    Past Surgical History:  Procedure Laterality Date  . ABDOMINAL HYSTERECTOMY     total  . APPENDECTOMY    . CATARACT EXTRACTION Bilateral   . CHOLECYSTECTOMY    . LEG SURGERY     as child    There were no vitals filed for this visit.      Subjective Assessment - 03/30/16 1028    Subjective "I stressed my side a little this morning trying to cut toenails."   Patient is accompained by: Family member   Limitations House hold activities;Walking   Patient Stated Goals "I want to make sure I have the right kind of support for my feet since I have foot drop."     Currently in Pain? Yes   Pain Score 3    Pain Location Hip   Pain Orientation Right   Pain Descriptors / Indicators Aching   Pain Type Acute pain   Pain Onset Yesterday   Pain Frequency Intermittent            TE and NMR:  Went over complete HEP, see pt instruction for details on reps and exercises performed.  Did most exercises x 5 reps instead of x 10 reps due to time  constraint.                       PT Education - 03/30/16 1134    Education provided Yes   Education Details continue to go over HEP   Person(s) Educated Patient;Spouse   Methods Explanation;Demonstration;Handout   Comprehension Verbalized understanding;Returned demonstration          PT Short Term Goals - 02/14/16 1214      PT SHORT TERM GOAL #1   Title Pt will initiate HEP in order to improve BLE strength and functional mobility.  (Target Date: 02/06/16)   Baseline pt doing intermittently at home.    Time 4   Period Weeks   Status Partially Met     PT SHORT TERM GOAL #2   Title Will assess BERG score and improve score by 4 points in order to indicate decreased fall risk.     Baseline 9/56 on 01/29/16   Time 4   Period Weeks   Status Achieved     PT SHORT TERM GOAL #3   Title Pt will improve gait speed to 1.66 ft/sec w/ LRAD in order to indicate improved efficiency of gait.  Baseline 1.84 ft/sec with rollator on 02/14/16   Time 4   Period Weeks   Status Achieved     PT SHORT TERM GOAL #4   Title Will have formal assessment of BLE bracing in order to reduce fall risk.     Baseline met    Time 4   Period Weeks   Status Achieved     PT SHORT TERM GOAL #5   Title Pt will negotiate up/down 14 steps with single rail at mod I level in order to indicate safety when negotiating to bonus room at home.    Time 4   Period Weeks   Status Not Met     PT SHORT TERM GOAL #6   Title Will assess 5TSS in order to better assess functional BLE strength.     Baseline 15.34 secs with BUE support on 02/14/16   Time 4   Period Weeks   Status Achieved           PT Long Term Goals - 03/09/16 1159      PT LONG TERM GOAL #1   Title Pt will be independent with HEP in order to indicate improved functional mobility and decreased fall risk.  (Target Date: 03/06/15)   Baseline met 03/09/16   Time 8   Period Weeks   Status Achieved     PT LONG TERM GOAL #2   Title  Pt will improve BERG balance test to 20/56 in order to indicate decreased fall risk.     Baseline 16/56, 7 point improvement on 03/09/16   Time 8   Period Weeks   Status Partially Met  change to revised next visit     PT LONG TERM GOAL #3   Title Pt will ambulate 500' w/ LRAD and appropriate bracing for BLEs at mod I level in order to indicate safe return to community mobility.     Baseline met 03/09/16   Time 8   Period Weeks   Status Achieved     PT LONG TERM GOAL #4   Title Pt will improve gait speed to 2.26 ft/sec w/ LRAD in order to indicate improved efficiency of gait and decreased fall risk.     Time 8   Period Weeks   Status On-going     PT LONG TERM GOAL #5   Title Pt will perform 5TSS test <20 seconds without UE support in order to indicate improved functional strength.     Time 8   Period Weeks   Status New     PT LONG TERM GOAL #6   Title Pt will ambulate up to 500' over unlevel paved outdoor surfaces (including curb/ramp) with LRAD at mod I level in order to indicate safe return to community mobility.     Time 4   Period Weeks   Status New               Plan - 03/30/16 1135    Clinical Impression Statement Skilled session went over complete HEP.  Pt making steady progress with both strength and balance exercises.  Continue to work towards D/C.    Rehab Potential Good   Clinical Impairments Affecting Rehab Potential progressive nature of neuropathy   PT Frequency 2x / week   PT Duration 4 weeks   PT Treatment/Interventions ADLs/Self Care Home Management;DME Instruction;Gait training;Stair training;Functional mobility training;Therapeutic activities;Therapeutic exercise;Balance training;Neuromuscular re-education;Orthotic Fit/Training;Patient/family education;Energy conservation;Vestibular   PT Next Visit Plan  balance, functional strength, standing balance with varying amounts of  support. Begin to look for seated yoga exercises and pool exercises.     Consulted and Agree with Plan of Care Patient;Family member/caregiver   Family Member Consulted husband      Patient will benefit from skilled therapeutic intervention in order to improve the following deficits and impairments:  Abnormal gait, Decreased activity tolerance, Decreased balance, Decreased knowledge of use of DME, Decreased mobility, Decreased strength, Impaired perceived functional ability, Impaired flexibility, Improper body mechanics, Postural dysfunction, Impaired sensation  Visit Diagnosis: Unsteadiness on feet  Muscle weakness (generalized)  Other abnormalities of gait and mobility  Other disturbances of skin sensation     Problem List There are no active problems to display for this patient.   Cameron Sprang, PT, MPT [facility] [address] [place], [place], [address] Phone: [phone]   Fax:  [phone] 03/30/16, 11:37 AM  Name: Nina Rush MRN: [id number] Date of Birth: 1940-06-25

## 2016-03-31 DIAGNOSIS — N390 Urinary tract infection, site not specified: Secondary | ICD-10-CM | POA: Diagnosis not present

## 2016-03-31 DIAGNOSIS — R319 Hematuria, unspecified: Secondary | ICD-10-CM | POA: Diagnosis not present

## 2016-04-02 ENCOUNTER — Ambulatory Visit: Payer: PPO | Admitting: Rehabilitation

## 2016-04-02 ENCOUNTER — Encounter: Payer: Self-pay | Admitting: Rehabilitation

## 2016-04-02 DIAGNOSIS — M6281 Muscle weakness (generalized): Secondary | ICD-10-CM

## 2016-04-02 DIAGNOSIS — R2681 Unsteadiness on feet: Secondary | ICD-10-CM | POA: Diagnosis not present

## 2016-04-02 DIAGNOSIS — R2689 Other abnormalities of gait and mobility: Secondary | ICD-10-CM

## 2016-04-02 DIAGNOSIS — R208 Other disturbances of skin sensation: Secondary | ICD-10-CM

## 2016-04-02 NOTE — Therapy (Signed)
[facility] [address] [place], [place], [address] Phone: [phone]   Fax:  [phone]  Physical Therapy Treatment  Patient Details  Name: Nina Rush MRN: [id number] Date of Birth: 1940/11/16 Referring Provider: Andrey Spearman  Encounter Date: 04/02/2016      PT End of Session - 04/02/16 0818    Visit Number 14   Number of Visits 19   Date for PT Re-Evaluation 05/12/16  updated POC for 60 days, only to see for 4 weeks planned   Authorization Type MCR- Gcode on every 10th visit   PT Start Time 0816  pt late to appt then needing to use restroom   PT Stop Time 0845   PT Time Calculation (min) 29 min   Activity Tolerance Patient tolerated treatment well   Behavior During Therapy [facility] for tasks assessed/performed      Past Medical History:  Diagnosis Date  . Charcot-Marie-Tooth disease   . Fuchs' adenoma    bilateral eyes  . Hypertension   . Lichen planus    scalp    Past Surgical History:  Procedure Laterality Date  . ABDOMINAL HYSTERECTOMY     total  . APPENDECTOMY    . CATARACT EXTRACTION Bilateral   . CHOLECYSTECTOMY    . LEG SURGERY     as child    There were no vitals filed for this visit.      Subjective Assessment - 04/02/16 0817    Subjective My back hurts a little.    Patient is accompained by: Family member   Limitations House hold activities;Walking   Patient Stated Goals "I want to make sure I have the right kind of support for my feet since I have foot drop."     Currently in Pain? Yes   Pain Score 1                          [facility] Adult PT Treatment/Exercise - 04/02/16 0845      Ambulation/Gait   Ambulation/Gait Yes   Ambulation/Gait Assistance 6: Modified independent (Device/Increase time)   Ambulation Distance (Feet) 100 Feet   Assistive device 4-wheeled walker   Gait Pattern Step-through pattern;Decreased stride length;Decreased dorsiflexion -  right;Decreased dorsiflexion - left;Lateral hip instability;Trunk flexed   Ambulation Surface Level;Indoor   Stairs Yes   Stairs Assistance 5: Supervision   Stairs Assistance Details (indicate cue type and reason) Performed x 3 reps with varying patterns to further increase LE strength.  Performed initially with B rails in alternating fashion>single UE support in step to fashion>single rail in alternating fashion to ascend and step to to descend for safety.    Stair Management Technique One rail Right;Two rails;Alternating pattern;Step to pattern;Forwards   Number of Stairs 4  x3 reps   Height of Stairs 6   Ramp 6: Modified independent (Device)   Curb 5: Supervision   Curb Details (indicate cue type and reason) single cue for locking rollator breaks when stepping down curb, otherwise mod I  x 2 reps     Neuro Re-ed    Neuro Re-ed Details  steps ups to 4" step with stance leg on foam balance beam with single UE support x 10 reps then with other LE in stance.  Maintaining balance on foam balance beam x 2 reps of 1 min with intermittent UE support, feet in semi tandem on foam balance beam x 2 sets of 30 secs each foot leading with intermittent UE  support, braiding x 3 reps of 12' with intermittent UE support as needed with cues for posture.                  PT Education - 04/02/16 [phone]    Education provided Yes   Education Details continuing to attempt stairs when able   Person(s) Educated Patient;Spouse   Methods Explanation   Comprehension Verbalized understanding          PT Short Term Goals - 02/14/16 1214      PT SHORT TERM GOAL #1   Title Pt will initiate HEP in order to improve BLE strength and functional mobility.  (Target Date: 02/06/16)   Baseline pt doing intermittently at home.    Time 4   Period Weeks   Status Partially Met     PT SHORT TERM GOAL #2   Title Will assess BERG score and improve score by 4 points in order to indicate decreased fall risk.      Baseline 9/56 on 01/29/16   Time 4   Period Weeks   Status Achieved     PT SHORT TERM GOAL #3   Title Pt will improve gait speed to 1.66 ft/sec w/ LRAD in order to indicate improved efficiency of gait.     Baseline 1.84 ft/sec with rollator on 02/14/16   Time 4   Period Weeks   Status Achieved     PT SHORT TERM GOAL #4   Title Will have formal assessment of BLE bracing in order to reduce fall risk.     Baseline met    Time 4   Period Weeks   Status Achieved     PT SHORT TERM GOAL #5   Title Pt will negotiate up/down 14 steps with single rail at mod I level in order to indicate safety when negotiating to bonus room at home.    Time 4   Period Weeks   Status Not Met     PT SHORT TERM GOAL #6   Title Will assess 5TSS in order to better assess functional BLE strength.     Baseline 15.34 secs with BUE support on 02/14/16   Time 4   Period Weeks   Status Achieved           PT Long Term Goals - 03/09/16 1159      PT LONG TERM GOAL #1   Title Pt will be independent with HEP in order to indicate improved functional mobility and decreased fall risk.  (Target Date: 03/06/15)   Baseline met 03/09/16   Time 8   Period Weeks   Status Achieved     PT LONG TERM GOAL #2   Title Pt will improve BERG balance test to 20/56 in order to indicate decreased fall risk.     Baseline 16/56, 7 point improvement on 03/09/16   Time 8   Period Weeks   Status Partially Met  change to revised next visit     PT LONG TERM GOAL #3   Title Pt will ambulate 500' w/ LRAD and appropriate bracing for BLEs at mod I level in order to indicate safe return to community mobility.     Baseline met 03/09/16   Time 8   Period Weeks   Status Achieved     PT LONG TERM GOAL #4   Title Pt will improve gait speed to 2.26 ft/sec w/ LRAD in order to indicate improved efficiency of gait and decreased fall risk.  Time 8   Period Weeks   Status On-going     PT LONG TERM GOAL #5   Title Pt will perform 5TSS test  <20 seconds without UE support in order to indicate improved functional strength.     Time 8   Period Weeks   Status New     PT LONG TERM GOAL #6   Title Pt will ambulate up to 500' over unlevel paved outdoor surfaces (including curb/ramp) with LRAD at mod I level in order to indicate safe return to community mobility.     Time 4   Period Weeks   Status New               Plan - 04/02/16 1118    Clinical Impression Statement Skilled session looking towards LTGs with ramp/curb negotiation with rollator, stairs for functional strength and varried balance activities in // bars.  Tolerated well.     Rehab Potential Good   Clinical Impairments Affecting Rehab Potential progressive nature of neuropathy   PT Frequency 2x / week   PT Duration 4 weeks   PT Treatment/Interventions ADLs/Self Care Home Management;DME Instruction;Gait training;Stair training;Functional mobility training;Therapeutic activities;Therapeutic exercise;Balance training;Neuromuscular re-education;Orthotic Fit/Training;Patient/family education;Energy conservation;Vestibular   PT Next Visit Plan Begin to look at [facility], look for handout on pool exercises and seated yoga.     Consulted and Agree with Plan of Care Patient;Family member/caregiver   Family Member Consulted husband      Patient will benefit from skilled therapeutic intervention in order to improve the following deficits and impairments:  Abnormal gait, Decreased activity tolerance, Decreased balance, Decreased knowledge of use of DME, Decreased mobility, Decreased strength, Impaired perceived functional ability, Impaired flexibility, Improper body mechanics, Postural dysfunction, Impaired sensation  Visit Diagnosis: Unsteadiness on feet  Muscle weakness (generalized)  Other abnormalities of gait and mobility  Other disturbances of skin sensation     Problem List There are no active problems to display for this patient.  Cameron Sprang, PT,  MPT [facility] [address] [place], [place], [address] Phone: [phone]   Fax:  [phone] 04/02/16, 11:20 AM  Name: Nina Rush MRN: [id number] Date of Birth: 07-Nov-1940

## 2016-04-13 ENCOUNTER — Encounter: Payer: Self-pay | Admitting: Rehabilitation

## 2016-04-13 ENCOUNTER — Ambulatory Visit: Payer: PPO | Attending: Diagnostic Neuroimaging | Admitting: Rehabilitation

## 2016-04-13 DIAGNOSIS — R2681 Unsteadiness on feet: Secondary | ICD-10-CM

## 2016-04-13 DIAGNOSIS — R208 Other disturbances of skin sensation: Secondary | ICD-10-CM | POA: Diagnosis not present

## 2016-04-13 DIAGNOSIS — M6281 Muscle weakness (generalized): Secondary | ICD-10-CM | POA: Diagnosis not present

## 2016-04-13 DIAGNOSIS — R2689 Other abnormalities of gait and mobility: Secondary | ICD-10-CM | POA: Diagnosis not present

## 2016-04-13 NOTE — Therapy (Signed)
[facility] [address] [place], [place], [address] Phone: [phone]   Fax:  [phone]  Physical Therapy Treatment  Patient Details  Name: Nina Rush MRN: [id number] Date of Birth: 04/18/1940 Referring Provider: Andrey Spearman  Encounter Date: 04/13/2016      PT End of Session - 04/13/16 1200    Visit Number 15   Number of Visits 19   Date for PT Re-Evaluation 05/12/16  updated POC for 60 days, only to see for 4 weeks planned   Authorization Type MCR- Gcode on every 10th visit   PT Start Time 1155  pt late to session   PT Stop Time 1240  did not bill whole time as she used restroom   PT Time Calculation (min) 45 min   Activity Tolerance Patient tolerated treatment well   Behavior During Therapy [facility] for tasks assessed/performed      Past Medical History:  Diagnosis Date  . Charcot-Marie-Tooth disease   . Fuchs' adenoma    bilateral eyes  . Hypertension   . Lichen planus    scalp    Past Surgical History:  Procedure Laterality Date  . ABDOMINAL HYSTERECTOMY     total  . APPENDECTOMY    . CATARACT EXTRACTION Bilateral   . CHOLECYSTECTOMY    . LEG SURGERY     as child    There were no vitals filed for this visit.      Subjective Assessment - 04/13/16 1159    Subjective No changes to report, no falls.     Patient is accompained by: Family member   Limitations House hold activities;Walking   Patient Stated Goals "I want to make sure I have the right kind of support for my feet since I have foot drop."     Currently in Pain? No/denies                         [facility] Adult PT Treatment/Exercise - 04/13/16 0001      Ambulation/Gait   Ambulation/Gait Yes   Ambulation/Gait Assistance 6: Modified independent (Device/Increase time)   Ambulation/Gait Assistance Details Assessed gait over unlevel paved surfaces including curb and ramp to assess LTG.  Note pt able to ambulate up to  500' at mod I level with good return of safety with negotiating curb with rollator.  Encouraged them to begin walking at home when weather is nice to work on endurance.    Ambulation Distance (Feet) 500 Feet   Assistive device 4-wheeled walker   Gait Pattern Step-through pattern;Decreased stride length;Decreased dorsiflexion - right;Decreased dorsiflexion - left;Lateral hip instability;Trunk flexed   Ambulation Surface Level;Unlevel;Indoor;Outdoor;Paved   Gait velocity 2.00 ft/sec with rollator and B foot up braces   Ramp 6: Modified independent (Device)   Curb 6: Modified independent (Device/increase time)     Self-Care   Self-Care Other Self-Care Comments   Other Self-Care Comments  Continue to go over why therapy is ending, how to maintain gains she has made in therapy.  possible return in 3 months for a "refresher" however that this episode would likely only be 4 weeks long to go over what is still difficult, provide education and exercises as needed then D/C.  Educated that she has got to begin to "live outside of therapy."  Pt and spouse verbalized understanding.      Exercises   Exercises Other Exercises   Other Exercises  Went over standing hip abd and standing hip extension per  pt request.  Cues for proper posture and alignment x 10 reps each.  Performed SL hip abd with straight leg, however this was too difficult, therefore had her remain doing clam exercise with yellow band for resistance x 10 reps and prone knee flex x 10 reps with cues on technique and to spouse on how to cue for alignment.                  PT Education - 04/13/16 1257    Education provided Yes   Education Details see self care   Person(s) Educated Patient;Spouse   Methods Explanation   Comprehension Verbalized understanding          PT Short Term Goals - 02/14/16 1214      PT SHORT TERM GOAL #1   Title Pt will initiate HEP in order to improve BLE strength and functional mobility.  (Target Date:  02/06/16)   Baseline pt doing intermittently at home.    Time 4   Period Weeks   Status Partially Met     PT SHORT TERM GOAL #2   Title Will assess BERG score and improve score by 4 points in order to indicate decreased fall risk.     Baseline 9/56 on 01/29/16   Time 4   Period Weeks   Status Achieved     PT SHORT TERM GOAL #3   Title Pt will improve gait speed to 1.66 ft/sec w/ LRAD in order to indicate improved efficiency of gait.     Baseline 1.84 ft/sec with rollator on 02/14/16   Time 4   Period Weeks   Status Achieved     PT SHORT TERM GOAL #4   Title Will have formal assessment of BLE bracing in order to reduce fall risk.     Baseline met    Time 4   Period Weeks   Status Achieved     PT SHORT TERM GOAL #5   Title Pt will negotiate up/down 14 steps with single rail at mod I level in order to indicate safety when negotiating to bonus room at home.    Time 4   Period Weeks   Status Not Met     PT SHORT TERM GOAL #6   Title Will assess 5TSS in order to better assess functional BLE strength.     Baseline 15.34 secs with BUE support on 02/14/16   Time 4   Period Weeks   Status Achieved           PT Long Term Goals - 04/13/16 1205      PT LONG TERM GOAL #1   Title Pt will be independent with HEP in order to indicate improved functional mobility and decreased fall risk.  (Target Date: 03/06/15)   Baseline met 03/09/16   Time 8   Period Weeks   Status Achieved     PT LONG TERM GOAL #2   Title Pt will improve BERG balance test to 20/56 in order to indicate decreased fall risk.     Baseline 16/56, 7 point improvement on 03/09/16   Time 8   Period Weeks   Status Partially Met  change to revised next visit     PT LONG TERM GOAL #3   Title Pt will ambulate 500' w/ LRAD and appropriate bracing for BLEs at mod I level in order to indicate safe return to community mobility.     Baseline met 03/09/16   Time 8   Period Weeks  Status Achieved     PT LONG TERM GOAL #4    Title Pt will improve gait speed to 2.26 ft/sec w/ LRAD in order to indicate improved efficiency of gait and decreased fall risk.     Baseline 2.00 ft/sec on 04/13/16 with rollator and foot up brace    Time 8   Period Weeks   Status Partially Met     PT LONG TERM GOAL #5   Title Pt will perform 5TSS test <20 seconds without UE support in order to indicate improved functional strength.     Baseline 20.44 secs with single UE support   Time 8   Period Weeks   Status Partially Met     PT LONG TERM GOAL #6   Title Pt will ambulate up to 500' over unlevel paved outdoor surfaces (including curb/ramp) with LRAD at mod I level in order to indicate safe return to community mobility.     Baseline met 04/13/16   Time 4   Period Weeks   Status Achieved               Plan - 04/13/16 1259    Clinical Impression Statement Session beginning to look at LTGs with outdoor gait, meeting mod I level for this goal, 5TSS for functional strength and gait speed.  She has not met either of the last two goals but do note improvement from last testing.  Continue to educate on ending therapy and continued community fitness at D/C.    Rehab Potential Good   Clinical Impairments Affecting Rehab Potential progressive nature of neuropathy   PT Frequency 2x / week   PT Duration 4 weeks   PT Treatment/Interventions ADLs/Self Care Home Management;DME Instruction;Gait training;Stair training;Functional mobility training;Therapeutic activities;Therapeutic exercise;Balance training;Neuromuscular re-education;Orthotic Fit/Training;Patient/family education;Energy conservation;Vestibular   PT Next Visit Plan LTG and D/C, seated yoga and pool exercises.    Consulted and Agree with Plan of Care Patient;Family member/caregiver   Family Member Consulted husband      Patient will benefit from skilled therapeutic intervention in order to improve the following deficits and impairments:  Abnormal gait, Decreased activity  tolerance, Decreased balance, Decreased knowledge of use of DME, Decreased mobility, Decreased strength, Impaired perceived functional ability, Impaired flexibility, Improper body mechanics, Postural dysfunction, Impaired sensation  Visit Diagnosis: Unsteadiness on feet  Muscle weakness (generalized)  Other abnormalities of gait and mobility  Other disturbances of skin sensation     Problem List There are no active problems to display for this patient.   Cameron Sprang, PT, MPT [facility] [address] [place], [place], [address] Phone: [phone]   Fax:  [phone] 04/13/16, 1:01 PM  Name: Rio Taber MRN: [id number] Date of Birth: 23-Dec-1940

## 2016-04-15 DIAGNOSIS — L661 Lichen planopilaris: Secondary | ICD-10-CM | POA: Diagnosis not present

## 2016-04-16 ENCOUNTER — Ambulatory Visit: Payer: PPO | Admitting: Rehabilitation

## 2016-04-24 ENCOUNTER — Encounter: Payer: Self-pay | Admitting: Rehabilitation

## 2016-04-24 ENCOUNTER — Ambulatory Visit: Payer: PPO | Admitting: Rehabilitation

## 2016-04-24 DIAGNOSIS — M6281 Muscle weakness (generalized): Secondary | ICD-10-CM

## 2016-04-24 DIAGNOSIS — R208 Other disturbances of skin sensation: Secondary | ICD-10-CM

## 2016-04-24 DIAGNOSIS — R2689 Other abnormalities of gait and mobility: Secondary | ICD-10-CM

## 2016-04-24 DIAGNOSIS — R2681 Unsteadiness on feet: Secondary | ICD-10-CM | POA: Diagnosis not present

## 2016-04-24 NOTE — Therapy (Signed)
[facility] [address] [place], [place], [address] Phone: [phone]   Fax:  [phone]  Physical Therapy Treatment and D/C Summary  Patient Details  Name: Nina Rush MRN: [id number] Date of Birth: 1940-10-10 Referring Provider: Andrey Spearman  Encounter Date: 04/24/2016      PT End of Session - 04/24/16 0945    Visit Number 16   Number of Visits 19   Date for PT Re-Evaluation 05/12/16  updated POC for 60 days, only to see for 4 weeks planned   Authorization Type MCR- Gcode on every 10th visit   PT Start Time 0939  pt late to session   PT Stop Time 1018   PT Time Calculation (min) 39 min   Activity Tolerance Patient tolerated treatment well   Behavior During Therapy [facility] for tasks assessed/performed      Past Medical History:  Diagnosis Date  . Charcot-Marie-Tooth disease   . Fuchs' adenoma    bilateral eyes  . Hypertension   . Lichen planus    scalp    Past Surgical History:  Procedure Laterality Date  . ABDOMINAL HYSTERECTOMY     total  . APPENDECTOMY    . CATARACT EXTRACTION Bilateral   . CHOLECYSTECTOMY    . LEG SURGERY     as child    There were no vitals filed for this visit.      Subjective Assessment - 04/24/16 0940    Subjective No changes to report, no falls.    Patient is accompained by: Family member   Limitations House hold activities;Walking   Patient Stated Goals "I want to make sure I have the right kind of support for my feet since I have foot drop."     Currently in Pain? No/denies                         [facility] Adult PT Treatment/Exercise - 04/24/16 0946      Standardized Balance Assessment   Standardized Balance Assessment Berg Balance Test     Berg Balance Test   Sit to Stand Able to stand  independently using hands   Standing Unsupported Needs several tries to stand 30 seconds unsupported   Sitting with Back Unsupported but Feet Supported on  Floor or Stool Able to sit safely and securely 2 minutes   Stand to Sit Sits safely with minimal use of hands   Transfers Able to transfer safely, definite need of hands   Standing Unsupported with Eyes Closed Needs help to keep from falling   Standing Ubsupported with Feet Together Needs help to attain position and unable to hold for 15 seconds   From Standing, Reach Forward with Outstretched Arm Loses balance while trying/requires external support   From Standing Position, Pick up Object from Floor Unable to try/needs assist to keep balance   From Standing Position, Turn to Look Behind Over each Shoulder Needs assist to keep from losing balance and falling   Turn 360 Degrees Needs assistance while turning   Standing Unsupported, Alternately Place Feet on Step/Stool Able to complete >2 steps/needs minimal assist   Standing Unsupported, One Foot in [employer] balance while stepping or standing   Standing on One Leg Unable to try or needs assist to prevent fall   Total Score 16     Self-Care   Self-Care Other Self-Care Comments   Other Self-Care Comments  Provided pt with handouts from the [facility] center regarding  aquatic classes and also chair yoga per pt request.  She also is wanting to know about [facility]'s aquatic PT program, therefore PT will email this contact to have them try and get that scheduled.       Exercises   Exercises Other Exercises   Other Exercises  Went over mat level therex per pt request.  Performed prone hamstring curls x 10 reps BLE, SL clam exercise with and without yellow band, however this was too easy and she was unable to feel in appropriate muscle group with increased activation in bottom LE.  Therefore switched to SL straight leg abduction x 10 reps.  Pt able to tolerate this better than last session.                  PT Education - 04/24/16 1018    Education provided Yes   Education Details HEP, see self care   Person(s) Educated Patient;Spouse    Methods Explanation;Handout;Demonstration   Comprehension Verbalized understanding;Returned demonstration          PT Short Term Goals - 02/14/16 1214      PT SHORT TERM GOAL #1   Title Pt will initiate HEP in order to improve BLE strength and functional mobility.  (Target Date: 02/06/16)   Baseline pt doing intermittently at home.    Time 4   Period Weeks   Status Partially Met     PT SHORT TERM GOAL #2   Title Will assess BERG score and improve score by 4 points in order to indicate decreased fall risk.     Baseline 9/56 on 01/29/16   Time 4   Period Weeks   Status Achieved     PT SHORT TERM GOAL #3   Title Pt will improve gait speed to 1.66 ft/sec w/ LRAD in order to indicate improved efficiency of gait.     Baseline 1.84 ft/sec with rollator on 02/14/16   Time 4   Period Weeks   Status Achieved     PT SHORT TERM GOAL #4   Title Will have formal assessment of BLE bracing in order to reduce fall risk.     Baseline met    Time 4   Period Weeks   Status Achieved     PT SHORT TERM GOAL #5   Title Pt will negotiate up/down 14 steps with single rail at mod I level in order to indicate safety when negotiating to bonus room at home.    Time 4   Period Weeks   Status Not Met     PT SHORT TERM GOAL #6   Title Will assess 5TSS in order to better assess functional BLE strength.     Baseline 15.34 secs with BUE support on 02/14/16   Time 4   Period Weeks   Status Achieved           PT Long Term Goals - 04/24/16 1125      PT LONG TERM GOAL #1   Title Pt will be independent with HEP in order to indicate improved functional mobility and decreased fall risk.  (Updated Target Date: 05/12/16)   Baseline met 03/09/16   Time 8   Period Weeks   Status Achieved     PT LONG TERM GOAL #2   Title Pt will improve BERG balance test to 20/56 in order to indicate decreased fall risk.     Baseline 16/56, 7 point improvement on 03/09/16, continues to have 16/56 on 04/24/16   Time 8  Period Weeks   Status Not Met  change to revised next visit     PT LONG TERM GOAL #3   Title Pt will ambulate 500' w/ LRAD and appropriate bracing for BLEs at mod I level in order to indicate safe return to community mobility.     Baseline met 03/09/16   Time 8   Period Weeks   Status Achieved     PT LONG TERM GOAL #4   Title Pt will improve gait speed to 2.26 ft/sec w/ LRAD in order to indicate improved efficiency of gait and decreased fall risk.     Baseline 2.00 ft/sec on 04/13/16 with rollator and foot up brace    Time 8   Period Weeks   Status Partially Met     PT LONG TERM GOAL #5   Title Pt will perform 5TSS test <20 seconds without UE support in order to indicate improved functional strength.     Baseline 20.44 secs with single UE support   Time 8   Period Weeks   Status Partially Met     PT LONG TERM GOAL #6   Title Pt will ambulate up to 500' over unlevel paved outdoor surfaces (including curb/ramp) with LRAD at mod I level in order to indicate safe return to community mobility.     Baseline met 04/13/16   Time 4   Period Weeks   Status Achieved               Plan - 04/27/2016 0946    Clinical Impression Statement Pt has met 3/6 LTGs, partially meeting 5TSS goal and gait speed goal.  She continues to make very slow progress with strength and is maintaining her balance per BERG balance test.  Discussed that this is expected due to significance of her neuropathy.  Also continue to educate that HEP will now be her responsibility and how to progress as needed.  Provided info on aquatic PT, classes and chair yoga.     Rehab Potential Good   Clinical Impairments Affecting Rehab Potential progressive nature of neuropathy   PT Frequency 2x / week   PT Duration 4 weeks   PT Treatment/Interventions ADLs/Self Care Home Management;DME Instruction;Gait training;Stair training;Functional mobility training;Therapeutic activities;Therapeutic exercise;Balance training;Neuromuscular  re-education;Orthotic Fit/Training;Patient/family education;Energy conservation;Vestibular   PT Next Visit Plan --   Consulted and Agree with Plan of Care Patient;Family member/caregiver   Family Member Consulted husband      Patient will benefit from skilled therapeutic intervention in order to improve the following deficits and impairments:  Abnormal gait, Decreased activity tolerance, Decreased balance, Decreased knowledge of use of DME, Decreased mobility, Decreased strength, Impaired perceived functional ability, Impaired flexibility, Improper body mechanics, Postural dysfunction, Impaired sensation  Visit Diagnosis: Unsteadiness on feet  Muscle weakness (generalized)  Other abnormalities of gait and mobility  Other disturbances of skin sensation       G-Codes - 2016/04/27 1128    Functional Assessment Tool Used (Outpatient Only) BERG: 16/56 on 27-Apr-2016   Functional Limitation Mobility: Walking and moving around   Mobility: Walking and Moving Around Current Status [phone]) At least 60 percent but less than 80 percent impaired, limited or restricted   Mobility: Walking and Moving Around Goal Status [phone]) At least 60 percent but less than 80 percent impaired, limited or restricted   Mobility: Walking and Moving Around Discharge Status [phone]) At least 60 percent but less than 80 percent impaired, limited or restricted      PHYSICAL THERAPY DISCHARGE SUMMARY  Visits from Start of Care: 16  Current functional level related to goals / functional outcomes: See above   Remaining deficits: Pt continues to have significant strength and balance deficits related to neuropathy.  Discussed this with pt and spouse in length.    Education / Equipment: HEP and extensive education  Plan: Patient agrees to discharge.  Patient goals were partially met. Patient is being discharged due to meeting the stated rehab goals.  ?????        Problem List There are no active problems to  display for this patient.   Cameron Sprang, PT, MPT [facility] [address] [place], [place], [address] Phone: [phone]   Fax:  [phone] 04/24/16, 11:32 AM  Name: Lachandra Dettmann MRN: [id number] Date of Birth: May 09, 1940

## 2016-04-28 DIAGNOSIS — G600 Hereditary motor and sensory neuropathy: Secondary | ICD-10-CM | POA: Diagnosis not present

## 2016-04-28 DIAGNOSIS — N905 Atrophy of vulva: Secondary | ICD-10-CM | POA: Diagnosis not present

## 2016-04-28 DIAGNOSIS — N3946 Mixed incontinence: Secondary | ICD-10-CM | POA: Diagnosis not present

## 2016-04-28 DIAGNOSIS — Z01419 Encounter for gynecological examination (general) (routine) without abnormal findings: Secondary | ICD-10-CM | POA: Diagnosis not present

## 2016-04-28 DIAGNOSIS — N952 Postmenopausal atrophic vaginitis: Secondary | ICD-10-CM | POA: Diagnosis not present

## 2016-04-28 DIAGNOSIS — N3941 Urge incontinence: Secondary | ICD-10-CM | POA: Diagnosis not present

## 2016-04-28 DIAGNOSIS — K469 Unspecified abdominal hernia without obstruction or gangrene: Secondary | ICD-10-CM | POA: Diagnosis not present

## 2016-05-26 DIAGNOSIS — R319 Hematuria, unspecified: Secondary | ICD-10-CM | POA: Diagnosis not present

## 2016-05-26 DIAGNOSIS — N390 Urinary tract infection, site not specified: Secondary | ICD-10-CM | POA: Diagnosis not present

## 2016-06-01 ENCOUNTER — Telehealth: Payer: Self-pay | Admitting: Diagnostic Neuroimaging

## 2016-06-01 DIAGNOSIS — E785 Hyperlipidemia, unspecified: Secondary | ICD-10-CM | POA: Diagnosis not present

## 2016-06-01 DIAGNOSIS — I10 Essential (primary) hypertension: Secondary | ICD-10-CM | POA: Diagnosis not present

## 2016-06-01 DIAGNOSIS — G609 Hereditary and idiopathic neuropathy, unspecified: Secondary | ICD-10-CM

## 2016-06-01 NOTE — Telephone Encounter (Signed)
Spoke to pt and she stated Kathryn?  I will contact Raquel Sarna with Neuro rehab for more inform.

## 2016-06-01 NOTE — Telephone Encounter (Signed)
Patient called office requesting a letter being placed in the [facility] (referral) for patient to be able to due aquatic physical therapy.  Please call

## 2016-06-02 NOTE — Telephone Encounter (Signed)
Spoke to pt and she gave me the name of person to contact ([place]) at [facility].  I messaged her at [facility] to find out more.

## 2016-06-03 NOTE — Addendum Note (Signed)
Addended byOliver Hum on: 06/03/2016 03:43 PM   Modules accepted: Orders

## 2016-06-04 DIAGNOSIS — K1379 Other lesions of oral mucosa: Secondary | ICD-10-CM | POA: Diagnosis not present

## 2016-06-04 DIAGNOSIS — R319 Hematuria, unspecified: Secondary | ICD-10-CM | POA: Diagnosis not present

## 2016-06-04 DIAGNOSIS — N390 Urinary tract infection, site not specified: Secondary | ICD-10-CM | POA: Diagnosis not present

## 2016-06-04 DIAGNOSIS — R350 Frequency of micturition: Secondary | ICD-10-CM | POA: Diagnosis not present

## 2016-06-04 DIAGNOSIS — I10 Essential (primary) hypertension: Secondary | ICD-10-CM | POA: Diagnosis not present

## 2016-06-04 DIAGNOSIS — E785 Hyperlipidemia, unspecified: Secondary | ICD-10-CM | POA: Diagnosis not present

## 2016-06-04 NOTE — Telephone Encounter (Signed)
Spoke to PT, and yes it is done at [facility] rehab.  Order placed.

## 2016-06-04 NOTE — Addendum Note (Signed)
Addended byOliver Hum on: 06/04/2016 01:37 PM   Modules accepted: Orders

## 2016-06-20 DIAGNOSIS — N3001 Acute cystitis with hematuria: Secondary | ICD-10-CM | POA: Diagnosis not present

## 2016-06-20 DIAGNOSIS — R1033 Periumbilical pain: Secondary | ICD-10-CM | POA: Diagnosis not present

## 2016-07-02 ENCOUNTER — Ambulatory Visit: Payer: Medicare Other | Admitting: Physical Therapy

## 2016-07-02 DIAGNOSIS — R109 Unspecified abdominal pain: Secondary | ICD-10-CM | POA: Diagnosis not present

## 2016-07-02 DIAGNOSIS — N390 Urinary tract infection, site not specified: Secondary | ICD-10-CM | POA: Diagnosis not present

## 2016-07-08 ENCOUNTER — Ambulatory Visit: Payer: PPO | Attending: Diagnostic Neuroimaging | Admitting: Physical Therapy

## 2016-07-08 ENCOUNTER — Encounter: Payer: Self-pay | Admitting: Physical Therapy

## 2016-07-08 DIAGNOSIS — R2681 Unsteadiness on feet: Secondary | ICD-10-CM | POA: Insufficient documentation

## 2016-07-08 DIAGNOSIS — M6281 Muscle weakness (generalized): Secondary | ICD-10-CM | POA: Diagnosis not present

## 2016-07-08 NOTE — Therapy (Addendum)
[facility] MAIN [facility] SERVICES [address] [place], [place], [address] Phone: [phone]   Fax:  [phone]  Physical Therapy Evaluation  Patient Details  Name: Nyaira Hodgens MRN: [id number] Date of Birth: 07-10-1940 Referring Provider: Penni Bombard   Encounter Date: 07/08/2016      PT End of Session - 07/08/16 1021    Visit Number 1   Number of Visits 17   Date for PT Re-Evaluation 10-Sep-2016   Authorization Type g codes 1/10   PT Start Time 1004   PT Stop Time 1100   PT Time Calculation (min) 56 min   Equipment Utilized During Treatment Gait belt   Activity Tolerance Patient tolerated treatment well   Behavior During Therapy WFL for tasks assessed/performed      Past Medical History:  Diagnosis Date  . Charcot-Marie-Tooth disease   . Fuchs' adenoma    bilateral eyes  . Hypertension   . Lichen planus    scalp    Past Surgical History:  Procedure Laterality Date  . ABDOMINAL HYSTERECTOMY     total  . APPENDECTOMY    . CATARACT EXTRACTION Bilateral   . CHOLECYSTECTOMY    . LEG SURGERY     as child    There were no vitals filed for this visit.       Subjective Assessment - 07/08/16 1012    Subjective Patient is having LE and UE weakness that is getting worse. Patient is not as able to walk without AD and she used to be able to walk without her walker. She is having difficulty wiith balance.    Patient is accompained by: Family member   Pertinent History Patient is living with her husband and has used a Radiation protection practitioner for 10 years. She is not able to walk as far as she used to be able to and gets fatigued quicker. She has help with the cleaning and she does some of the cooking.    Limitations Walking;Standing   How long can you stand comfortably? 10 minutes   How long can you walk comfortably? 5  minutes   Patient Stated Goals She wants to walk longer and also wants to walk without her AD, and walk without fear of falling,  She would also like to to shopping and not need to use the scooter at the store.    Currently in Pain? No/denies   Multiple Pain Sites No            [facility] PT Assessment - 07/08/16 1017      Assessment   Medical Diagnosis peripheral neuropathy   Referring Provider Andrey Spearman R    Onset Date/Surgical Date 06/04/16   Hand Dominance Right   Next MD Visit october   Prior Therapy [place] march 2018     Restrictions   Weight Bearing Restrictions No     Balance Screen   Has the patient fallen in the past 6 months No   Has the patient had a decrease in activity level because of a fear of falling?  Yes   Is the patient reluctant to leave their home because of a fear of falling?  No     Home Environment   Living Environment Private residence   Living Arrangements Spouse/significant other   Available Help at Discharge Family;Available PRN/intermittently   Type of Home House   Home Access Level entry   Home Layout One level   [place] - 4 wheels;Grab bars -  tub/shower;Grab bars - toilet;Shower seat - built in     Prior Function   Level of Independence Independent with household mobility with device;Requires assistive device for independence   Vocation Retired   Leisure entertain guests, [employer] to work out, [employer]   Overall Cognitive Status Within [employer] for tasks assessed   Attention Focused        PAIN:  No reports of pain BLE , occassional pain in back   POSTURE: WFL  PROM/AROM: WFL   STRENGTH:  Graded on a 0-5 scale Muscle Group Left Right  Shoulder flex 3+/5 3+/5  Shoulder Abd 3+/5 3+/5  Shoulder Ext 4/5 4/5  Shoulder IR/ER 4/5 4/5  Elbow 4/5 4/5  Wrist/hand 10  lbs 12 lbs  Hip Flex 3/5 3/5  Hip Abd 2/5 2+/5  Hip Add 2/5 2+/5  Hip Ext -3/5 -3/5  Hip IR/ER    Knee Flex 4/5 4/5  Knee Ext 4/5 4/5  Ankle DF 1/5 1/5  Ankle PF 0/5 0/5   SENSATION: numbness in BLE feet and calfs bilaterally    FUNCTIONAL  MOBILITY:slow mobility with rolling and supine to sit   BALANCE:unable to tandem stand or single leg stand, needs UE support with static standing   GAIT:  Amublates with rollator slow gait speed with BLE ankle braces for DF assist moderate distances with standing rest due to fatigue  OUTCOME MEASURES: 10 meter walk test      .55           m/s <1.0 m/s indicates increased risk for falls; limited community ambulator  Timed up and Go   26.26              sec <14 sec indicates increased risk for falls  6 minute walk test     450           Feet 1000 feet is community ambulator            Treatment: Sit to stand x 10 without UE support                                 PT Education - 07/08/16 1021    Education provided Yes   Education Details plan of care   Person(s) Educated Patient   Methods Explanation   Comprehension Verbalized understanding             PT Long Term Goals - 07/08/16 1035      PT LONG TERM GOAL #1   Title Pt will be independent with HEP in order to indicate improved functional mobility and decreased fall risk.     Baseline BLE  ranges in strength from 2+/5 hip abd to -3/5 strength    Time 8   Period Weeks   Status New     PT LONG TERM GOAL #2   Title Patient will reduce timed up and go to <11 seconds to reduce fall risk and demonstrate improved transfer/gait ability.   Baseline 26.26 sec   Time 8   Period Weeks   Status New     PT LONG TERM GOAL #3   Title Patient will increase six minute walk test distance to >1000 for progression to community ambulator and improve gait ability   Baseline 450 feet   Time 8   Period Weeks   Status New     PT LONG TERM GOAL #4  Title Pt will improve gait speed to  1. 33 m/sec w/ LRAD in order to indicate improved efficiency of gait and decreased fall risk.     Baseline . 55 m/sec with rollator and BLE feet braces   Time 8   Period Weeks   Status New     PT LONG TERM GOAL #5   Title Pt  will perform 5TSS test <20 seconds without UE support in order to indicate improved functional strength.     Baseline 38.59 sec   Time 8   Status New                Plan - 07/12/16 1024    Clinical Impression Statement Patient recently had out [facility]inet PT at [facility] and presents with weakness in BLE and decreased static and dynamic standing balance.Patient presents with decreased gait speed, decreased balance, and decreased BLE strength. Patient's main complaint is BLE weakness and inability to participate in desired activities. Further PT examination revealed inability to tolerate single leg or tandem stance, as well as outcome measures that show the patient is at a risk for falls. Patient will benefit from skilled PT in order to increase gait speed, increase BLE strength, and improve dynamic standing balance to decrease risk for falls and enable patient to participate in desired activities   History and Personal Factors relevant to plan of care: This patient presents with 1 personal factors/ comorbidities current situation of deconditioning, and 3 body elements including body structures and functions, activity limitations and or participation restrictions decreased strength BLE, decreased static and dynamic staniding balance, decreased ambulation.  Patient's condition is  evolving   Clinical Presentation Evolving   Clinical Presentation due to: decreased strength , decreased ambulation, decreased sensation BLE feet   Clinical Decision Making Moderate   Rehab Potential Good   Clinical Impairments Affecting Rehab Potential decreased sensation B feet   PT Frequency 2x / week   PT Duration 8 weeks   PT Treatment/Interventions Aquatic Therapy;Functional mobility training;Gait training;Therapeutic activities;Therapeutic exercise;Balance training;Neuromuscular re-education;Patient/family education   PT Next Visit Plan aquatic therapy   PT Home Exercise Plan sit to stand from chair    Consulted and Agree with Plan of Care Family member/caregiver;Patient      Patient will benefit from skilled therapeutic intervention in order to improve the following deficits and impairments:  Abnormal gait, Decreased balance, Decreased endurance, Decreased mobility, Difficulty walking, Impaired sensation, Decreased activity tolerance, Decreased strength  Visit Diagnosis: Muscle weakness (generalized)  Unsteadiness on feet      G-Codes - 2016-07-12 1039    Functional Assessment Tool Used (Outpatient Only) TUG, 5 x sit to stand, 10 MW, 6 MW   Functional Limitation Mobility: Walking and moving around   Mobility: Walking and Moving Around Current Status [phone]) At least 60 percent but less than 80 percent impaired, limited or restricted   Mobility: Walking and Moving Around Goal Status ([id number]) At least 40 percent but less than 60 percent impaired, limited or restricted       Problem List There are no active problems to display for this patient. Alanson Puls, PT, DPT [place], [place] 2016-07-12, [place] MAIN [facility] SERVICES [address] [place], [place], [address] Phone: [phone]   Fax:  [phone]  Name: Lelia Jons MRN: [id number] Date of Birth: 09-19-1940

## 2016-07-14 ENCOUNTER — Ambulatory Visit: Payer: Medicare Other | Admitting: Physical Therapy

## 2016-07-14 DIAGNOSIS — Z08 Encounter for follow-up examination after completed treatment for malignant neoplasm: Secondary | ICD-10-CM | POA: Diagnosis not present

## 2016-07-14 DIAGNOSIS — L570 Actinic keratosis: Secondary | ICD-10-CM | POA: Diagnosis not present

## 2016-07-14 DIAGNOSIS — D485 Neoplasm of uncertain behavior of skin: Secondary | ICD-10-CM | POA: Diagnosis not present

## 2016-07-14 DIAGNOSIS — L661 Lichen planopilaris: Secondary | ICD-10-CM | POA: Diagnosis not present

## 2016-07-14 DIAGNOSIS — Z85828 Personal history of other malignant neoplasm of skin: Secondary | ICD-10-CM | POA: Diagnosis not present

## 2016-07-14 DIAGNOSIS — L820 Inflamed seborrheic keratosis: Secondary | ICD-10-CM | POA: Diagnosis not present

## 2016-07-15 DIAGNOSIS — N390 Urinary tract infection, site not specified: Secondary | ICD-10-CM | POA: Diagnosis not present

## 2016-07-15 DIAGNOSIS — R319 Hematuria, unspecified: Secondary | ICD-10-CM | POA: Diagnosis not present

## 2016-07-16 ENCOUNTER — Ambulatory Visit: Payer: PPO | Admitting: Physical Therapy

## 2016-07-21 ENCOUNTER — Ambulatory Visit: Payer: Medicare Other | Admitting: Physical Therapy

## 2016-07-23 ENCOUNTER — Ambulatory Visit: Payer: PPO | Admitting: Physical Therapy

## 2016-08-04 ENCOUNTER — Ambulatory Visit: Payer: Medicare Other | Admitting: Physical Therapy

## 2016-08-06 ENCOUNTER — Ambulatory Visit: Payer: PPO | Admitting: Physical Therapy

## 2016-08-11 ENCOUNTER — Ambulatory Visit: Payer: PPO | Admitting: Physical Therapy

## 2016-08-18 ENCOUNTER — Ambulatory Visit: Payer: PPO | Admitting: Physical Therapy

## 2016-08-20 ENCOUNTER — Ambulatory Visit: Payer: PPO | Admitting: Physical Therapy

## 2016-08-27 ENCOUNTER — Ambulatory Visit: Payer: PPO | Admitting: Physical Therapy

## 2016-09-01 ENCOUNTER — Encounter: Payer: Self-pay | Admitting: Physical Therapy

## 2016-09-01 DIAGNOSIS — M6281 Muscle weakness (generalized): Secondary | ICD-10-CM

## 2016-09-01 DIAGNOSIS — R2681 Unsteadiness on feet: Secondary | ICD-10-CM

## 2016-09-01 NOTE — Therapy (Signed)
[place] MAIN [facility] SERVICES [address] [place], [place], [address] Phone: [phone]   Fax:  [phone]  September 01, 2016   '@[email or web address]'$ @  Physical Therapy Discharge Summary  Patient: Nina Rush  MRN: [id number]  Date of Birth: 08/31/1940   Diagnosis: Muscle weakness (generalized)  Unsteadiness on feet Referring Provider: Andrey Spearman R   The above patient had been seen in Physical Therapy  1 times of  10 treatments scheduled with 0  no shows and 10 cancellations.  The treatment consisted of  Evaluation only. Patient failed to return for additional follow up visits.  The patient is: Unchanged    No Goals Met    Sincerely,   Halena Mohar, Sherryl Barters, PT, DPT   CC '@[email or web address]'$ @  [facility] [address] [place], [place], [address] Phone: [phone]   Fax:  [phone]  Patient: Nina Rush  MRN: [id number]  Date of Birth: 10-22-1940

## 2016-09-03 ENCOUNTER — Ambulatory Visit: Payer: PPO | Admitting: Physical Therapy

## 2016-09-09 ENCOUNTER — Encounter: Payer: PPO | Admitting: Physical Therapy

## 2016-09-10 DIAGNOSIS — T1490XA Injury, unspecified, initial encounter: Secondary | ICD-10-CM | POA: Diagnosis not present

## 2016-09-24 DIAGNOSIS — N390 Urinary tract infection, site not specified: Secondary | ICD-10-CM | POA: Diagnosis not present

## 2016-09-24 DIAGNOSIS — R319 Hematuria, unspecified: Secondary | ICD-10-CM | POA: Diagnosis not present

## 2016-09-25 DIAGNOSIS — G609 Hereditary and idiopathic neuropathy, unspecified: Secondary | ICD-10-CM | POA: Diagnosis not present

## 2016-09-25 DIAGNOSIS — M2041 Other hammer toe(s) (acquired), right foot: Secondary | ICD-10-CM | POA: Diagnosis not present

## 2016-09-25 DIAGNOSIS — M2042 Other hammer toe(s) (acquired), left foot: Secondary | ICD-10-CM | POA: Diagnosis not present

## 2016-09-25 DIAGNOSIS — L600 Ingrowing nail: Secondary | ICD-10-CM | POA: Diagnosis not present

## 2016-09-28 DIAGNOSIS — R5383 Other fatigue: Secondary | ICD-10-CM | POA: Diagnosis not present

## 2016-09-28 DIAGNOSIS — R600 Localized edema: Secondary | ICD-10-CM | POA: Diagnosis not present

## 2016-09-28 DIAGNOSIS — N3000 Acute cystitis without hematuria: Secondary | ICD-10-CM | POA: Diagnosis not present

## 2016-09-28 DIAGNOSIS — M810 Age-related osteoporosis without current pathological fracture: Secondary | ICD-10-CM | POA: Diagnosis not present

## 2016-09-28 DIAGNOSIS — R002 Palpitations: Secondary | ICD-10-CM | POA: Diagnosis not present

## 2016-09-30 DIAGNOSIS — R9431 Abnormal electrocardiogram [ECG] [EKG]: Secondary | ICD-10-CM | POA: Diagnosis not present

## 2016-10-08 ENCOUNTER — Ambulatory Visit: Payer: PPO | Attending: Diagnostic Neuroimaging

## 2016-10-08 DIAGNOSIS — R262 Difficulty in walking, not elsewhere classified: Secondary | ICD-10-CM | POA: Diagnosis not present

## 2016-10-08 DIAGNOSIS — M6281 Muscle weakness (generalized): Secondary | ICD-10-CM | POA: Diagnosis not present

## 2016-10-08 NOTE — Therapy (Signed)
Pt entered/exited pool via ramp and use of rail Exercises as follows: Fwd walk with B kick boards, 4 laps mild speed Side step with B kick boards, 4 laps mild speed and Min guard/Min A at hips. Focus on posture/position and use of abductors versus hip flexors Core strength/UE strength in wall sit position. Shoulders in water. Shoulder flex/ext, abd/add, horiz abd/add. Fingers closed, mild speed. Minisquat with increased time proper core control and isometric quad/glute squeeze at top of exercise. 20x Hip abduction at rail with focus on neutral hip position, 15x each

## 2016-10-15 ENCOUNTER — Ambulatory Visit: Payer: PPO | Attending: Diagnostic Neuroimaging

## 2016-10-15 DIAGNOSIS — R208 Other disturbances of skin sensation: Secondary | ICD-10-CM | POA: Diagnosis not present

## 2016-10-15 DIAGNOSIS — M6281 Muscle weakness (generalized): Secondary | ICD-10-CM | POA: Diagnosis not present

## 2016-10-15 DIAGNOSIS — R262 Difficulty in walking, not elsewhere classified: Secondary | ICD-10-CM | POA: Diagnosis not present

## 2016-10-15 DIAGNOSIS — R2689 Other abnormalities of gait and mobility: Secondary | ICD-10-CM | POA: Diagnosis not present

## 2016-10-15 DIAGNOSIS — R2681 Unsteadiness on feet: Secondary | ICD-10-CM

## 2016-10-15 NOTE — Therapy (Signed)
Pt entered/exited pool via ramp with Min guard assist exiting.  Performs exercises as follows:  Fwd ambulation 2 laps without UE support, 2 with kick board UE support. Focus on increased stride with balance, 4 laps Side step ambulation with kick boards for UE support, 4 laps Minisquats, 20x Wall sit UE abdominal stabilization exercises, shoulder flex/ext, abd/add, horizontal abd/add, 20x each. Increased speed this session.(Mild+). Cues for LEs maintained in proper position. Arms taught. (R wrist breaks with flex) Stand B hip exercises with abdominal stabilization for flexion (to neutral), extension (to neutral), abd, hip/knee flexion (march), 20x each. B hamstrings stretch at step; 3 x 15 seconds. B hamstrings stretch, seated on bench 3 x 30 seconds.  Review of written handouts for water exercises

## 2016-10-15 NOTE — Patient Instructions (Signed)
Pt and spouse provided with written program for aquatic ambulation, stand hip exercises with core stabilization and wall sit core stabilization exercises with UE's.

## 2016-10-21 DIAGNOSIS — L661 Lichen planopilaris: Secondary | ICD-10-CM | POA: Diagnosis not present

## 2016-10-22 ENCOUNTER — Ambulatory Visit: Payer: PPO

## 2016-10-26 DIAGNOSIS — N3941 Urge incontinence: Secondary | ICD-10-CM | POA: Diagnosis not present

## 2016-10-26 DIAGNOSIS — N390 Urinary tract infection, site not specified: Secondary | ICD-10-CM | POA: Diagnosis not present

## 2016-10-29 ENCOUNTER — Ambulatory Visit: Payer: PPO

## 2016-10-29 DIAGNOSIS — R002 Palpitations: Secondary | ICD-10-CM | POA: Diagnosis not present

## 2016-10-29 DIAGNOSIS — R2689 Other abnormalities of gait and mobility: Secondary | ICD-10-CM

## 2016-10-29 DIAGNOSIS — R208 Other disturbances of skin sensation: Secondary | ICD-10-CM

## 2016-10-29 DIAGNOSIS — N390 Urinary tract infection, site not specified: Secondary | ICD-10-CM | POA: Diagnosis not present

## 2016-10-29 DIAGNOSIS — R262 Difficulty in walking, not elsewhere classified: Secondary | ICD-10-CM

## 2016-10-29 DIAGNOSIS — R2681 Unsteadiness on feet: Secondary | ICD-10-CM

## 2016-10-29 DIAGNOSIS — M6281 Muscle weakness (generalized): Secondary | ICD-10-CM

## 2016-10-29 DIAGNOSIS — I10 Essential (primary) hypertension: Secondary | ICD-10-CM | POA: Diagnosis not present

## 2016-10-29 NOTE — Therapy (Signed)
Pt enters/exits pool via ramp Care taker present throughout session. 4 laps fwd ambulation with kick boards under arms for balance support. Focus on upright posture 4 laps side stepping with kick boards under arms for balance support. Focus on activating glut med muscle; avoiding compensatory patterns. Wall squat position core exercises with UEs: - triceps press down, green dumb bells, 25x -sh flex/ext, horizontal abd/add, abd/add, 25x each Mini squats, 25x Core/hip strength with stand hip exercises: - flex/ext in swing pattern, 20x  - abd/add, 20x ea Stretching:  Low back, 3 position at rail; right, center, left, 3 x 15 seconds each B hamstrings stretch, 3 x 15 seconds each.

## 2016-11-05 ENCOUNTER — Ambulatory Visit: Payer: PPO

## 2016-11-09 DIAGNOSIS — L300 Nummular dermatitis: Secondary | ICD-10-CM | POA: Diagnosis not present

## 2016-11-12 ENCOUNTER — Ambulatory Visit: Payer: PPO | Attending: Diagnostic Neuroimaging

## 2016-11-12 DIAGNOSIS — R262 Difficulty in walking, not elsewhere classified: Secondary | ICD-10-CM | POA: Insufficient documentation

## 2016-11-12 DIAGNOSIS — M6281 Muscle weakness (generalized): Secondary | ICD-10-CM | POA: Diagnosis not present

## 2016-11-12 DIAGNOSIS — R208 Other disturbances of skin sensation: Secondary | ICD-10-CM | POA: Insufficient documentation

## 2016-11-12 DIAGNOSIS — R2689 Other abnormalities of gait and mobility: Secondary | ICD-10-CM | POA: Insufficient documentation

## 2016-11-12 DIAGNOSIS — R2681 Unsteadiness on feet: Secondary | ICD-10-CM | POA: Insufficient documentation

## 2016-11-12 NOTE — Therapy (Signed)
Pt enters/exits pool via ramp  Pt participates in (along with spouse) following exercises:  Ambulation, focus on posture, stride, balance: - 4 L fwd with kick board arm support - 4 L sidestep, kick board arm support  Core stabilization in wall sit position: 20x each - shoulder horiz abd/add - shoulder flex/ext - shoulder abd/add  LE exercises with core stabilization: 20x each - minisquats - hip flex/ext swing - hip abd/add  Balance: - free standing ball toss/catch, 8 mn  Stretching: 3 x 15 seconds each - hamstrings, B - gastroc, B

## 2016-11-13 DIAGNOSIS — N3941 Urge incontinence: Secondary | ICD-10-CM | POA: Diagnosis not present

## 2016-11-19 ENCOUNTER — Ambulatory Visit: Payer: PPO

## 2016-11-23 DIAGNOSIS — R002 Palpitations: Secondary | ICD-10-CM | POA: Diagnosis not present

## 2016-11-23 DIAGNOSIS — I10 Essential (primary) hypertension: Secondary | ICD-10-CM | POA: Diagnosis not present

## 2016-11-25 DIAGNOSIS — I071 Rheumatic tricuspid insufficiency: Secondary | ICD-10-CM | POA: Diagnosis not present

## 2016-11-25 DIAGNOSIS — H1851 Endothelial corneal dystrophy: Secondary | ICD-10-CM | POA: Diagnosis not present

## 2016-11-25 DIAGNOSIS — Z961 Presence of intraocular lens: Secondary | ICD-10-CM | POA: Diagnosis not present

## 2016-11-26 ENCOUNTER — Ambulatory Visit: Payer: PPO

## 2016-11-26 DIAGNOSIS — R208 Other disturbances of skin sensation: Secondary | ICD-10-CM

## 2016-11-26 DIAGNOSIS — R2689 Other abnormalities of gait and mobility: Secondary | ICD-10-CM

## 2016-11-26 DIAGNOSIS — R262 Difficulty in walking, not elsewhere classified: Secondary | ICD-10-CM

## 2016-11-26 DIAGNOSIS — R2681 Unsteadiness on feet: Secondary | ICD-10-CM

## 2016-11-26 DIAGNOSIS — M6281 Muscle weakness (generalized): Secondary | ICD-10-CM

## 2016-11-26 NOTE — Therapy (Signed)
Pt enters/exits pool with Min guard via ramp Participates in the following  Ambulation - 6 L fwd, arms on kick boards - 6 L side, arms on kick boards  Abdominal stab in wall sit, 20x ea - triceps press down, green dumbells - sh flex/ext  - sh abd/add - sh horiz abd/add  Abdominal stab with LEs, 20x ea - Minisquats  - hip flex/ext - hip abd/add  Balance, 10 min - ball toss/catch (with spouse on deck, Min guard/assist

## 2016-12-01 ENCOUNTER — Ambulatory Visit: Payer: PPO

## 2016-12-01 DIAGNOSIS — R262 Difficulty in walking, not elsewhere classified: Secondary | ICD-10-CM

## 2016-12-01 DIAGNOSIS — R2689 Other abnormalities of gait and mobility: Secondary | ICD-10-CM

## 2016-12-01 DIAGNOSIS — R208 Other disturbances of skin sensation: Secondary | ICD-10-CM

## 2016-12-01 DIAGNOSIS — R2681 Unsteadiness on feet: Secondary | ICD-10-CM

## 2016-12-01 NOTE — Therapy (Signed)
[place] MAIN [facility] SERVICES [address] [place], [place], [address] Phone: [phone]   Fax:  [phone]  Physical Therapy Treatment  Patient Details  Name: Consepcion Rush MRN: [id number] Date of Birth: [birth year] Referring Provider: Penni Bombard   Encounter Date: 12/01/2016      PT End of Session - 12/01/16 1335    Visit Number 7   PT Start Time 0910   PT Stop Time 0955   PT Time Calculation (min) 45 min      Past Medical History:  Diagnosis Date  . Charcot-Marie-Tooth disease   . Fuchs' adenoma    bilateral eyes  . Hypertension   . Lichen planus    scalp    Past Surgical History:  Procedure Laterality Date  . ABDOMINAL HYSTERECTOMY     total  . APPENDECTOMY    . CATARACT EXTRACTION Bilateral   . CHOLECYSTECTOMY    . LEG SURGERY     as child    There were no vitals filed for this visit.      Subjective Assessment - 12/01/16 1332    Subjective Pt with no voiced complaints; balance noted and subjectively at baseline. Spouse participating in the water today for education to be able to assist spouse.      Pt enters/exits pool via ramp Participates in the following  Ambulation with kickboards - 6 L fwd - 6 L side  Core stabilization in wall sit with UEs, 20x each - 1 green dumbbell, 2 hands shoulder ext - sh flex/ext - sh abd/add - sh horiz abd/add  Stand core stabilization with LEs - flex/ext - abd/add - minisquats  Seated bicycle (instructed by student PT), 28min  Stretching (instructed by student PT) - hamstrings - gastroc                            PT Education - 12/01/16 1334    Education provided Yes   Education Details Continued education for pt/spouse on core stabilization exercises, ambulation activities and stretching.              PT Long Term Goals - 12/01/16 1337      PT LONG TERM GOAL #1   Title Pt will be independent with HEP in order to indicate  improved functional mobility and decreased fall risk.     Baseline BLE  ranges in strength from 2+/5 hip abd to -3/5 strength    Time 8   Period Weeks   Status New     PT LONG TERM GOAL #2   Title Patient will reduce timed up and go to <11 seconds to reduce fall risk and demonstrate improved transfer/gait ability.   Baseline 26.26 sec   Time 8   Period Weeks   Status New     PT LONG TERM GOAL #3   Title Patient will increase six minute walk test distance to >1000 for progression to community ambulator and improve gait ability   Baseline 450 feet   Time 8   Period Weeks   Status New     PT LONG TERM GOAL #4   Title Pt will improve gait speed to  1. 33 m/sec w/ LRAD in order to indicate improved efficiency of gait and decreased fall risk.     Baseline . 55 m/sec with rollator and BLE feet braces   Period Weeks   Status New     PT LONG TERM  GOAL #5   Title Pt will perform 5TSS test <20 seconds without UE support in order to indicate improved functional strength.     Baseline 38.59 sec   Time 8   Period Weeks               Plan - 12/01/16 1336    Clinical Impression Statement Improved balance with all activities today. Continued cueing for abduction stepping and with hip exercises for proper LE placement/alignment. Cues for increasing speed with core stabilization with UEs for improved strengthening benefits.    PT Next Visit Plan Pt notes losing original home aquatic program provided. Will provide before discharge      Patient will benefit from skilled therapeutic intervention in order to improve the following deficits and impairments:     Visit Diagnosis: Difficulty in walking, not elsewhere classified  Unsteadiness on feet  Other abnormalities of gait and mobility  Other disturbances of skin sensation     Problem List There are no active problems to display for this patient.   Larae Grooms 12/01/2016, 1:41 PM  [place] MAIN [facility] SERVICES [address] [place], [place], [address] Phone: [phone]   Fax:  [phone]  Name: Nina Rush MRN: [id number] Date of Birth: [birth year]

## 2016-12-04 DIAGNOSIS — E785 Hyperlipidemia, unspecified: Secondary | ICD-10-CM | POA: Diagnosis not present

## 2016-12-04 DIAGNOSIS — R319 Hematuria, unspecified: Secondary | ICD-10-CM | POA: Diagnosis not present

## 2016-12-04 DIAGNOSIS — N390 Urinary tract infection, site not specified: Secondary | ICD-10-CM | POA: Diagnosis not present

## 2016-12-08 ENCOUNTER — Ambulatory Visit: Payer: PPO

## 2016-12-08 DIAGNOSIS — R2689 Other abnormalities of gait and mobility: Secondary | ICD-10-CM

## 2016-12-08 DIAGNOSIS — M6281 Muscle weakness (generalized): Secondary | ICD-10-CM

## 2016-12-08 DIAGNOSIS — R2681 Unsteadiness on feet: Secondary | ICD-10-CM

## 2016-12-08 DIAGNOSIS — R262 Difficulty in walking, not elsewhere classified: Secondary | ICD-10-CM

## 2016-12-08 DIAGNOSIS — R208 Other disturbances of skin sensation: Secondary | ICD-10-CM

## 2016-12-08 NOTE — Therapy (Signed)
[place] MAIN [facility] SERVICES [address] [place], [place], [address] Phone: [phone]   Fax:  [phone]  Physical Therapy Treatment  Patient Details  Name: Nina Rush MRN: [id number] Date of Birth: May 30, 1940 Referring Provider: Penni Bombard   Encounter Date: 12/08/2016      PT End of Session - 12/08/16 1647    Visit Number 8   PT Start Time 9417   PT Stop Time 1150   PT Time Calculation (min) 65 min   Activity Tolerance Patient tolerated treatment well   Behavior During Therapy [facility] for tasks assessed/performed      Past Medical History:  Diagnosis Date  . Charcot-Marie-Tooth disease   . Fuchs' adenoma    bilateral eyes  . Hypertension   . Lichen planus    scalp    Past Surgical History:  Procedure Laterality Date  . ABDOMINAL HYSTERECTOMY     total  . APPENDECTOMY    . CATARACT EXTRACTION Bilateral   . CHOLECYSTECTOMY    . LEG SURGERY     as child    There were no vitals filed for this visit.      Subjective Assessment - 12/08/16 1644    Subjective Pt reports feeling pretty good today; notes left hip is feeling stronger and states balance feels good today as well.      Ambulation, green dumbbells for support - 4 L fwd - 4 L side, occasional cues for toe in lead foot and hips squared  Stepping activities, 20-25x - Alternating step up/off, chest deep water; lead R/L - Partial step up, bottom pool step; only up to on toe opposite leg, B  Abdominal stabilization with LE exercise, 20-25x - Hip abd/add - Hip flex/ext - Minisquats  Core stabilization with UE exercises - Shoulder flex/ext - Shoulder abd/add - Shoulder horizontal abd/add  Balance - Ball toss/catch, no support  Stretching, at step, 3 x 15 sec each - Hamstrings - Gastrocs   Education on progression of exercises through use of speed, less UE support and less water depth, as appropriate.                               PT Education - 12/08/16 1645    Education provided Yes   Education Details Educated on step activity for strengthening, both full stepping in chest deep water and small partial step in waist deep water.    Person(s) Educated Patient   Methods Explanation;Demonstration;Verbal cues   Comprehension Verbalized understanding;Returned demonstration             PT Long Term Goals - 12/08/16 1650      PT LONG TERM GOAL #1   Title Pt will be independent with HEP in order to indicate improved functional mobility and decreased fall risk.     Baseline BLE  ranges in strength from 2+/5 hip abd to -3/5 strength    Time 8   Period Weeks   Status New     PT LONG TERM GOAL #2   Title Patient will reduce timed up and go to <11 seconds to reduce fall risk and demonstrate improved transfer/gait ability.   Baseline 26.26 sec   Time 8   Period Weeks   Status New     PT LONG TERM GOAL #3   Title Patient will increase six minute walk test distance to >1000 for progression to community ambulator and improve gait ability  Baseline 450 feet   Time 8   Period Weeks   Status New     PT LONG TERM GOAL #4   Title Pt will improve gait speed to  1. 33 m/sec w/ LRAD in order to indicate improved efficiency of gait and decreased fall risk.     Baseline . 55 m/sec with rollator and BLE feet braces   Period Weeks   Status New     PT LONG TERM GOAL #5   Title Pt will perform 5TSS test <20 seconds without UE support in order to indicate improved functional strength.     Baseline 38.59 sec   Time 8   Period Weeks               Plan - 12/08/16 1647    Clinical Impression Statement Pt tolerated all activities well today. Good understanding of step activities. Tolerated balance activities without physical assist and able to perform walking activities with less UE support, posing tolerable challenge. Pt to continue x 1 visit in the pool then  discharge to an independent aquatic program. Will provide written program and review with performance next visit.    PT Next Visit Plan Pt notes losing original home aquatic program provided. Will provide before discharge      Patient will benefit from skilled therapeutic intervention in order to improve the following deficits and impairments:     Visit Diagnosis: Difficulty in walking, not elsewhere classified  Unsteadiness on feet  Other abnormalities of gait and mobility  Other disturbances of skin sensation  Muscle weakness (generalized)     Problem List There are no active problems to display for this patient.   Larae Grooms 12/08/2016, 4:51 PM  [place] MAIN [facility] SERVICES [address] [place], [place], [address] Phone: [phone]   Fax:  [phone]  Name: Nina Rush MRN: [id number] Date of Birth: 11-Jan-1941

## 2016-12-11 DIAGNOSIS — Z23 Encounter for immunization: Secondary | ICD-10-CM | POA: Diagnosis not present

## 2016-12-11 DIAGNOSIS — R5383 Other fatigue: Secondary | ICD-10-CM | POA: Diagnosis not present

## 2016-12-11 DIAGNOSIS — I10 Essential (primary) hypertension: Secondary | ICD-10-CM | POA: Diagnosis not present

## 2016-12-11 DIAGNOSIS — K219 Gastro-esophageal reflux disease without esophagitis: Secondary | ICD-10-CM | POA: Diagnosis not present

## 2016-12-11 DIAGNOSIS — Z0000 Encounter for general adult medical examination without abnormal findings: Secondary | ICD-10-CM | POA: Diagnosis not present

## 2016-12-11 DIAGNOSIS — M810 Age-related osteoporosis without current pathological fracture: Secondary | ICD-10-CM | POA: Diagnosis not present

## 2016-12-11 DIAGNOSIS — N3281 Overactive bladder: Secondary | ICD-10-CM | POA: Diagnosis not present

## 2016-12-11 DIAGNOSIS — Z79899 Other long term (current) drug therapy: Secondary | ICD-10-CM | POA: Diagnosis not present

## 2016-12-15 ENCOUNTER — Ambulatory Visit: Payer: PPO | Attending: Diagnostic Neuroimaging | Admitting: Physical Therapy

## 2016-12-15 DIAGNOSIS — R2681 Unsteadiness on feet: Secondary | ICD-10-CM

## 2016-12-15 DIAGNOSIS — R262 Difficulty in walking, not elsewhere classified: Secondary | ICD-10-CM

## 2016-12-15 DIAGNOSIS — M6281 Muscle weakness (generalized): Secondary | ICD-10-CM

## 2016-12-15 DIAGNOSIS — R2689 Other abnormalities of gait and mobility: Secondary | ICD-10-CM | POA: Insufficient documentation

## 2016-12-15 NOTE — Therapy (Signed)
[place] MAIN [facility] SERVICES [address] [place], [place], [address] Phone: [phone]   Fax:  [phone]  Physical Therapy Treatment  Patient Details  Name: Nina Rush MRN: [id number] Date of Birth: [birth year] Referring Provider: Penni Bombard    Encounter Date: 12/15/2016  PT End of Session - 12/15/16 1446    Visit Number  9    PT Start Time  7510    PT Stop Time  1115    PT Time Calculation (min)  45 min    Activity Tolerance  Patient tolerated treatment well    Behavior During Therapy  [facility] for tasks assessed/performed       Past Medical History:  Diagnosis Date  . Charcot-Marie-Tooth disease   . Fuchs' adenoma    bilateral eyes  . Hypertension   . Lichen planus    scalp    Past Surgical History:  Procedure Laterality Date  . ABDOMINAL HYSTERECTOMY     total  . APPENDECTOMY    . CATARACT EXTRACTION Bilateral   . CHOLECYSTECTOMY    . LEG SURGERY     as child    There were no vitals filed for this visit.  Subjective Assessment - 12/15/16 1445    Subjective  Pt reports general fatigue today but ready for session.    Patient is accompained by:  Family member       Session focused on HEP review.  Pt was able to complete HEP program.  Walking forward and sidesteps with kick boards for support.  Emphasis on slow steady gait with proper foot placement and techniques  Standing exercises for hip strengthening at bar for hip flex/ext and ab/adduction, squats x 20  Step ups Right and Left with emphasis on quad contraction keeping one foot on step and other with toes remaining on pool floor.   Seated exercises on bench including bicycle, scissor and flutter kicks x 5 minutes each with emphasis on core stabilization.   Pt was given HEP handouts and education on how to tailor and adapt HEP as her strength increases or decreases.  Pt voiced good awareness of exercises and how to adapt them to her daily  needs.                       PT Education - 12/15/16 1445    Education Details  Pt given written HEP and reviewed fully as this is her last scheduled aquatic therapy session.    Person(s) Educated  Patient    Methods  Explanation;Handout;Demonstration    Comprehension  Verbalized understanding          PT Long Term Goals - 12/08/16 1650      PT LONG TERM GOAL #1   Title  Pt will be independent with HEP in order to indicate improved functional mobility and decreased fall risk.      Baseline  BLE  ranges in strength from 2+/5 hip abd to -3/5 strength     Time  8    Period  Weeks    Status  New      PT LONG TERM GOAL #2   Title  Patient will reduce timed up and go to <11 seconds to reduce fall risk and demonstrate improved transfer/gait ability.    Baseline  26.26 sec    Time  8    Period  Weeks    Status  New      PT LONG TERM GOAL #3  Title  Patient will increase six minute walk test distance to >1000 for progression to community ambulator and improve gait ability    Baseline  450 feet    Time  8    Period  Weeks    Status  New      PT LONG TERM GOAL #4   Title  Pt will improve gait speed to  1. 33 m/sec w/ LRAD in order to indicate improved efficiency of gait and decreased fall risk.      Baseline  . 55 m/sec with rollator and BLE feet braces    Period  Weeks    Status  New      PT LONG TERM GOAL #5   Title  Pt will perform 5TSS test <20 seconds without UE support in order to indicate improved functional strength.      Baseline  38.59 sec    Time  8    Period  Weeks            Plan - 12/15/16 1447    Clinical Impression Statement  Tolerated session well with good understanding of HEP.    Clinical Presentation  Stable    Clinical Decision Making  Low    Rehab Potential  Good    PT Next Visit Plan  Pt discharged this session.  HEP provided and pt with no further questions.         Patient will benefit from skilled therapeutic  intervention in order to improve the following deficits and impairments:     Visit Diagnosis: Difficulty in walking, not elsewhere classified  Unsteadiness on feet  Muscle weakness (generalized)     Problem List There are no active problems to display for this patient.   Chesley Noon, PTA 12/15/16, 3:56 PM  [place] [facility] SERVICES [address] [place], [place], [address] Phone: [phone]   Fax:  [phone]  Name: Nina Rush MRN: [id number] Date of Birth: 08-16-1940

## 2016-12-17 DIAGNOSIS — N390 Urinary tract infection, site not specified: Secondary | ICD-10-CM | POA: Diagnosis not present

## 2016-12-17 DIAGNOSIS — N3946 Mixed incontinence: Secondary | ICD-10-CM | POA: Diagnosis not present

## 2016-12-17 DIAGNOSIS — N3941 Urge incontinence: Secondary | ICD-10-CM | POA: Diagnosis not present

## 2016-12-24 ENCOUNTER — Encounter: Payer: Self-pay | Admitting: Physical Therapy

## 2016-12-24 ENCOUNTER — Ambulatory Visit: Payer: PPO | Admitting: Physical Therapy

## 2016-12-24 DIAGNOSIS — R262 Difficulty in walking, not elsewhere classified: Secondary | ICD-10-CM | POA: Diagnosis not present

## 2016-12-24 DIAGNOSIS — M6281 Muscle weakness (generalized): Secondary | ICD-10-CM | POA: Diagnosis not present

## 2016-12-24 DIAGNOSIS — R2681 Unsteadiness on feet: Secondary | ICD-10-CM

## 2016-12-24 DIAGNOSIS — R2689 Other abnormalities of gait and mobility: Secondary | ICD-10-CM | POA: Diagnosis not present

## 2016-12-24 NOTE — Therapy (Signed)
[place] MAIN [facility] SERVICES [address] [place], [place], [address] Phone: [phone]   Fax:  [phone]  Physical Therapy Evaluation  Patient Details  Name: Nina Rush MRN: [id number] Date of Birth: [birth year] Referring Provider: Penni Bombard    Encounter Date: 12/24/2016  PT End of Session - 12/24/16 1119    Visit Number  1    Authorization Type  1/10 g codes    PT Start Time  1100    PT Stop Time  1145    PT Time Calculation (min)  45 min    Equipment Utilized During Treatment  Gait belt    Activity Tolerance  Patient tolerated treatment well;Patient limited by fatigue    Behavior During Therapy  WFL for tasks assessed/performed       Past Medical History:  Diagnosis Date  . Charcot-Marie-Tooth disease   . Fuchs' adenoma    bilateral eyes  . Hypertension   . Lichen planus    scalp    Past Surgical History:  Procedure Laterality Date  . ABDOMINAL HYSTERECTOMY     total  . APPENDECTOMY    . CATARACT EXTRACTION Bilateral   . CHOLECYSTECTOMY    . LEG SURGERY     as child    There were no vitals filed for this visit.   Subjective Assessment - 12/24/16 1104    Subjective  Pt reports that she has made a little improvement.     Patient is accompained by:  Family member    Pertinent History  Patient has been walking with the rollator for around 9 years. She has not had any falls but she feels unsteady and has difficulty with steps.  She feels taht she is able to ambulate short and intermediate distances and is unable to ambulate long distances.     Patient Stated Goals  Patient wants to be able to walk with rollator and to not get any weaker .     Currently in Pain?  No/denies    Multiple Pain Sites  No         [facility] PT Assessment - 12/24/16 0001      Assessment   Medical Diagnosis  Idiopathic peripheral neuropathy    Referring Provider  PENUMALLI, Bonnita Levan R     Hand Dominance  Right      Precautions   Precautions  None      Balance Screen   Has the patient fallen in the past 6 months  No    Has the patient had a decrease in activity level because of a fear of falling?   No    Is the patient reluctant to leave their home because of a fear of falling?   No      Home Environment   Living Environment  Private residence    Living Arrangements  Spouse/significant other    Available Help at Discharge  Family;Available PRN/intermittently    Type of Home  House    Home Access  Level entry    Home Layout  One level    [place] - 4 wheels;Grab bars - tub/shower;Grab bars - toilet;Shower seat - built in      Prior Function   Level of Independence  Requires assistive device for independence;Independent with household mobility with device;Independent with community mobility with device;Independent with homemaking with ambulation    Vocation  Retired    Leisure  entertain, church, read      [employer]  Overall Cognitive Status  Within Functional Limits for tasks assessed       PAIN:  No reports of pain BLE , occassional pain in back   POSTURE: WFL  PROM/AROM: WFL   STRENGTH:  Graded on a 0-5 scale Muscle Group Left Right  Shoulder flex 3+/5 3+/5  Shoulder Abd 3+/5 3+/5  Shoulder Ext 4/5 4/5  Shoulder IR/ER 4/5 4/5  Elbow 4/5 4/5  Wrist/hand 10  lbs 12 lbs  Hip Flex 3/5 3/5  Hip Abd 2/5 2+/5  Hip Add 2/5 2+/5  Hip Ext -3/5 -3/5  Hip IR/ER    Knee Flex 4/5 4/5  Knee Ext 4/5 4/5  Ankle DF 1/5 1/5  Ankle PF 0/5 0/5   SENSATION: numbness in BLE feet and calfs bilaterally    FUNCTIONAL MOBILITY:slow mobility with rolling and supine to sit; Independent with transfers and ambulation with rollator   GAIT:  Amublates with rollator slow gait speed with BLE ankle braces for DF assist moderate distances with standing rest due to fatigue   BALANCE:unable to tandem stand or single leg stand, needs UE support with static standing Static Standing Balance  Normal  Able to maintain standing balance against maximal resistance   Good Able to maintain standing balance against moderate resistance   Good-/Fair+ Able to maintain standing balance against minimal resistance   Fair Able to stand unsupported without UE support and without LOB for 1-2 min x  Fair- Requires Min A and UE support to maintain standing without loss of balance   Poor+ Requires mod A and UE support to maintain standing without loss of balance   Poor Requires max A and UE support to maintain standing balance without loss    Standing Dynamic Balance  Normal Stand independently unsupported, able to weight shift and cross midline maximally   Good Stand independently unsupported, able to weight shift and cross midline moderately   Good-/Fair+ Stand independently unsupported, able to weight shift across midline minimally x  Fair Stand independently unsupported, weight shift, and reach ipsilaterally, loss of balance when crossing midline   Poor+ Able to stand with Min A and reach ipsilaterally, unable to weight shift   Poor Able to stand with Mod A and minimally reach ipsilaterally, unable to cross midline.     OUTCOME MEASURES: TEST Outcome Interpretation  5 times sit<>stand 19.52sec >63 yo, >15 sec indicates increased risk for falls  10 meter walk test    .63             m/s <1.0 m/s indicates increased risk for falls; limited community ambulator  Timed up and Go    23.14             sec <14 sec indicates increased risk for falls  6 minute walk test   620             Feet 1000 feet is community ambulator                  Objective measurements completed on examination: See above findings.              PT Education - 12/24/16 1105    Education provided  Yes    Education Details  review of goals    Person(s) Educated  Patient    Methods  Explanation;Verbal cues    Comprehension  Verbalized understanding;Returned demonstration       PT Short Term Goals - 12/24/16  1156      PT  SHORT TERM GOAL #1   Title  Pt will initiate HEP in order to improve BLE strength and functional mobility.  (Target Date: 02/06/16)    Time  4    Period  Weeks    Status  New    Target Date  01/21/17        PT Long Term Goals - 12/24/16 1120      PT LONG TERM GOAL #1   Title  Pt will be independent with HEP in order to indicate improved functional mobility and decreased fall risk.      Baseline  BLE  ranges in strength from 2+/5 hip abd to -3/5 strength     Time  8    Period  Weeks    Status  New    Target Date  02/18/17      PT LONG TERM GOAL #2   Title  Patient will reduce timed up and go to <11 seconds to reduce fall risk and demonstrate improved transfer/gait ability.    Baseline  23.14 sec    Time  8    Period  Weeks    Status  New    Target Date  02/18/17      PT LONG TERM GOAL #3   Title  Patient will increase six minute walk test distance to >1000 for progression to community ambulator and improve gait ability    Baseline  620 feet     Time  8    Period  Weeks    Status  New    Target Date  02/18/17      PT LONG TERM GOAL #4   Title  Pt will improve gait speed to  1. 33 m/sec w/ LRAD in order to indicate improved efficiency of gait and decreased fall risk.      Baseline  .63 m/sec    Time  8    Period  Weeks    Status  New    Target Date  02/18/17      PT LONG TERM GOAL #5   Title  Pt will perform 5TSS test <20 seconds without UE support in order to indicate improved functional strength.      Baseline  19.52 sec    Time  8    Period  Weeks    Status  New    Target Date  02/18/17             Plan - 12/24/16 1240    Clinical Impression Statement  Patient  presents with weakness in BLE and decreased static and dynamic standing balance .Patient presents with decreased gait speed, decreased balance, and decreased BLE strength. Patient's main complaint is BLE weakness and inability to participate in desired activities. Further PT  examination revealed inability to tolerate single leg or tandem stance, as well as outcome measures that show the patient is at a risk for falls. Patient will benefit from skilled PT in order to increase gait speed, increase BLE strength, and improve dynamic standing balance to decrease risk for falls and enable patient to participate in desired activities    History and Personal Factors relevant to plan of care:  This patient presents with 1 personal factors/ comorbidities current situation of deconditioning, and 3 body elements including body structures and functions, activity limitations and or participation restrictions decreased strength BLE, decreased static and dynamic standing balance, decreased ambulation.  Patient's condition is  evolving    Clinical Presentation  Evolving    Clinical Decision  Making  Low    Rehab Potential  Good    PT Frequency  1x / week    PT Duration  8 weeks    PT Treatment/Interventions  Aquatic Therapy;Gait training;Therapeutic activities;Therapeutic exercise    PT Next Visit Plan  Pt discharged this session.  HEP provided and pt with no further questions.      Consulted and Agree with Plan of Care  Patient       Patient will benefit from skilled therapeutic intervention in order to improve the following deficits and impairments:  Abnormal gait, Decreased balance, Decreased strength  Visit Diagnosis: Difficulty in walking, not elsewhere classified  Unsteadiness on feet  Muscle weakness (generalized)  Other abnormalities of gait and mobility  G-Codes - 2017-01-08 1158    Functional Assessment Tool Used (Outpatient Only)  5 x sit to stand, TUG, 6 MW, 10 MW    Functional Limitation  Mobility: Walking and moving around    Mobility: Walking and Moving Around Current Status [phone])  At least 80 percent but less than 100 percent impaired, limited or restricted    Mobility: Walking and Moving Around Goal Status [phone])  At least 60 percent but less than 80 percent  impaired, limited or restricted    Mobility: Walking and Moving Around Discharge Status [phone])  --        Problem List There are no active problems to display for this patient.   [address], [place] DPT January 08, 2017, 1:37 PM  [place] MAIN [facility] SERVICES [address] [place], [place], [address] Phone: [phone]   Fax:  [phone]  Name: Nina Rush MRN: [id number] Date of Birth: [birth year]

## 2016-12-28 ENCOUNTER — Other Ambulatory Visit: Payer: Self-pay | Admitting: *Deleted

## 2016-12-28 DIAGNOSIS — G609 Hereditary and idiopathic neuropathy, unspecified: Secondary | ICD-10-CM

## 2016-12-28 NOTE — Progress Notes (Signed)
Order place per Dr. Leta Baptist for PT re- assessment.

## 2017-01-04 DIAGNOSIS — H1851 Endothelial corneal dystrophy: Secondary | ICD-10-CM | POA: Diagnosis not present

## 2017-01-04 DIAGNOSIS — Z961 Presence of intraocular lens: Secondary | ICD-10-CM | POA: Diagnosis not present

## 2017-01-05 ENCOUNTER — Ambulatory Visit: Payer: PPO | Attending: Diagnostic Neuroimaging

## 2017-01-06 ENCOUNTER — Telehealth: Payer: Self-pay | Admitting: Diagnostic Neuroimaging

## 2017-01-06 NOTE — Telephone Encounter (Signed)
Patient's PCP  Derrill Center., MD   ordered aquatic therapy and she is really enjoying and it's helping her a lot.  Patient want's to hold of on Physical Therapy for a while. Dr. Leta Baptist please advise ?

## 2017-01-06 NOTE — Telephone Encounter (Signed)
Relayed to Patient's husband .

## 2017-01-06 NOTE — Telephone Encounter (Signed)
Ok. Agree with plan. -VRP 

## 2017-01-14 ENCOUNTER — Ambulatory Visit: Payer: PPO

## 2017-01-15 ENCOUNTER — Ambulatory Visit: Payer: PPO | Admitting: Physical Therapy

## 2017-01-21 ENCOUNTER — Ambulatory Visit: Payer: PPO

## 2017-01-25 ENCOUNTER — Encounter: Payer: PPO | Admitting: Physical Therapy

## 2017-02-18 DIAGNOSIS — Z780 Asymptomatic menopausal state: Secondary | ICD-10-CM | POA: Diagnosis not present

## 2017-02-18 DIAGNOSIS — Z1231 Encounter for screening mammogram for malignant neoplasm of breast: Secondary | ICD-10-CM | POA: Diagnosis not present

## 2017-02-18 DIAGNOSIS — M810 Age-related osteoporosis without current pathological fracture: Secondary | ICD-10-CM | POA: Diagnosis not present

## 2017-02-23 DIAGNOSIS — R2681 Unsteadiness on feet: Secondary | ICD-10-CM | POA: Diagnosis not present

## 2017-02-23 DIAGNOSIS — M6281 Muscle weakness (generalized): Secondary | ICD-10-CM | POA: Diagnosis not present

## 2017-02-23 DIAGNOSIS — R29898 Other symptoms and signs involving the musculoskeletal system: Secondary | ICD-10-CM | POA: Diagnosis not present

## 2017-02-23 DIAGNOSIS — G600 Hereditary motor and sensory neuropathy: Secondary | ICD-10-CM | POA: Diagnosis not present

## 2017-02-23 DIAGNOSIS — R2689 Other abnormalities of gait and mobility: Secondary | ICD-10-CM | POA: Diagnosis not present

## 2017-02-24 DIAGNOSIS — R928 Other abnormal and inconclusive findings on diagnostic imaging of breast: Secondary | ICD-10-CM | POA: Diagnosis not present

## 2017-02-25 DIAGNOSIS — H524 Presbyopia: Secondary | ICD-10-CM | POA: Diagnosis not present

## 2017-02-25 DIAGNOSIS — H04123 Dry eye syndrome of bilateral lacrimal glands: Secondary | ICD-10-CM | POA: Diagnosis not present

## 2017-02-25 DIAGNOSIS — H1851 Endothelial corneal dystrophy: Secondary | ICD-10-CM | POA: Diagnosis not present

## 2017-03-01 DIAGNOSIS — R2689 Other abnormalities of gait and mobility: Secondary | ICD-10-CM | POA: Diagnosis not present

## 2017-03-01 DIAGNOSIS — R2681 Unsteadiness on feet: Secondary | ICD-10-CM | POA: Diagnosis not present

## 2017-03-01 DIAGNOSIS — R29898 Other symptoms and signs involving the musculoskeletal system: Secondary | ICD-10-CM | POA: Diagnosis not present

## 2017-03-01 DIAGNOSIS — G600 Hereditary motor and sensory neuropathy: Secondary | ICD-10-CM | POA: Diagnosis not present

## 2017-03-01 DIAGNOSIS — M6281 Muscle weakness (generalized): Secondary | ICD-10-CM | POA: Diagnosis not present

## 2017-03-05 DIAGNOSIS — R29898 Other symptoms and signs involving the musculoskeletal system: Secondary | ICD-10-CM | POA: Diagnosis not present

## 2017-03-05 DIAGNOSIS — M6281 Muscle weakness (generalized): Secondary | ICD-10-CM | POA: Diagnosis not present

## 2017-03-05 DIAGNOSIS — R2681 Unsteadiness on feet: Secondary | ICD-10-CM | POA: Diagnosis not present

## 2017-03-05 DIAGNOSIS — R2689 Other abnormalities of gait and mobility: Secondary | ICD-10-CM | POA: Diagnosis not present

## 2017-03-05 DIAGNOSIS — G600 Hereditary motor and sensory neuropathy: Secondary | ICD-10-CM | POA: Diagnosis not present

## 2017-03-09 DIAGNOSIS — M6281 Muscle weakness (generalized): Secondary | ICD-10-CM | POA: Diagnosis not present

## 2017-03-09 DIAGNOSIS — R2689 Other abnormalities of gait and mobility: Secondary | ICD-10-CM | POA: Diagnosis not present

## 2017-03-09 DIAGNOSIS — R29898 Other symptoms and signs involving the musculoskeletal system: Secondary | ICD-10-CM | POA: Diagnosis not present

## 2017-03-09 DIAGNOSIS — R2681 Unsteadiness on feet: Secondary | ICD-10-CM | POA: Diagnosis not present

## 2017-03-09 DIAGNOSIS — G600 Hereditary motor and sensory neuropathy: Secondary | ICD-10-CM | POA: Diagnosis not present

## 2017-03-11 DIAGNOSIS — M6281 Muscle weakness (generalized): Secondary | ICD-10-CM | POA: Diagnosis not present

## 2017-03-11 DIAGNOSIS — G600 Hereditary motor and sensory neuropathy: Secondary | ICD-10-CM | POA: Diagnosis not present

## 2017-03-11 DIAGNOSIS — R2681 Unsteadiness on feet: Secondary | ICD-10-CM | POA: Diagnosis not present

## 2017-03-11 DIAGNOSIS — R29898 Other symptoms and signs involving the musculoskeletal system: Secondary | ICD-10-CM | POA: Diagnosis not present

## 2017-03-11 DIAGNOSIS — R2689 Other abnormalities of gait and mobility: Secondary | ICD-10-CM | POA: Diagnosis not present

## 2017-03-12 DIAGNOSIS — R2681 Unsteadiness on feet: Secondary | ICD-10-CM | POA: Diagnosis not present

## 2017-03-12 DIAGNOSIS — M6281 Muscle weakness (generalized): Secondary | ICD-10-CM | POA: Diagnosis not present

## 2017-03-12 DIAGNOSIS — R2689 Other abnormalities of gait and mobility: Secondary | ICD-10-CM | POA: Diagnosis not present

## 2017-03-12 DIAGNOSIS — G600 Hereditary motor and sensory neuropathy: Secondary | ICD-10-CM | POA: Diagnosis not present

## 2017-03-12 DIAGNOSIS — R29898 Other symptoms and signs involving the musculoskeletal system: Secondary | ICD-10-CM | POA: Diagnosis not present

## 2017-03-15 DIAGNOSIS — G600 Hereditary motor and sensory neuropathy: Secondary | ICD-10-CM | POA: Diagnosis not present

## 2017-03-15 DIAGNOSIS — M6281 Muscle weakness (generalized): Secondary | ICD-10-CM | POA: Diagnosis not present

## 2017-03-15 DIAGNOSIS — R29898 Other symptoms and signs involving the musculoskeletal system: Secondary | ICD-10-CM | POA: Diagnosis not present

## 2017-03-15 DIAGNOSIS — R2689 Other abnormalities of gait and mobility: Secondary | ICD-10-CM | POA: Diagnosis not present

## 2017-03-15 DIAGNOSIS — R2681 Unsteadiness on feet: Secondary | ICD-10-CM | POA: Diagnosis not present

## 2017-03-17 DIAGNOSIS — H57813 Brow ptosis, bilateral: Secondary | ICD-10-CM | POA: Diagnosis not present

## 2017-03-17 DIAGNOSIS — H02831 Dermatochalasis of right upper eyelid: Secondary | ICD-10-CM | POA: Diagnosis not present

## 2017-03-17 DIAGNOSIS — H02403 Unspecified ptosis of bilateral eyelids: Secondary | ICD-10-CM | POA: Diagnosis not present

## 2017-03-17 DIAGNOSIS — H02834 Dermatochalasis of left upper eyelid: Secondary | ICD-10-CM | POA: Diagnosis not present

## 2017-03-18 DIAGNOSIS — R600 Localized edema: Secondary | ICD-10-CM | POA: Diagnosis not present

## 2017-03-23 ENCOUNTER — Encounter (HOSPITAL_BASED_OUTPATIENT_CLINIC_OR_DEPARTMENT_OTHER): Payer: Self-pay | Admitting: Emergency Medicine

## 2017-03-23 ENCOUNTER — Emergency Department (HOSPITAL_BASED_OUTPATIENT_CLINIC_OR_DEPARTMENT_OTHER)
Admission: EM | Admit: 2017-03-23 | Discharge: 2017-03-24 | Disposition: A | Discharge status: Home / Self Care | Payer: PPO | Attending: Emergency Medicine | Admitting: Emergency Medicine

## 2017-03-23 ENCOUNTER — Other Ambulatory Visit: Payer: Self-pay

## 2017-03-23 DIAGNOSIS — R1084 Generalized abdominal pain: Secondary | ICD-10-CM | POA: Insufficient documentation

## 2017-03-23 DIAGNOSIS — R1013 Epigastric pain: Secondary | ICD-10-CM | POA: Diagnosis not present

## 2017-03-23 DIAGNOSIS — G600 Hereditary motor and sensory neuropathy: Secondary | ICD-10-CM | POA: Diagnosis not present

## 2017-03-23 DIAGNOSIS — R109 Unspecified abdominal pain: Secondary | ICD-10-CM

## 2017-03-23 DIAGNOSIS — R2681 Unsteadiness on feet: Secondary | ICD-10-CM | POA: Diagnosis not present

## 2017-03-23 DIAGNOSIS — R1032 Left lower quadrant pain: Secondary | ICD-10-CM | POA: Diagnosis not present

## 2017-03-23 DIAGNOSIS — R42 Dizziness and giddiness: Secondary | ICD-10-CM | POA: Diagnosis not present

## 2017-03-23 DIAGNOSIS — R110 Nausea: Secondary | ICD-10-CM | POA: Diagnosis not present

## 2017-03-23 DIAGNOSIS — K7689 Other specified diseases of liver: Secondary | ICD-10-CM | POA: Diagnosis not present

## 2017-03-23 DIAGNOSIS — I10 Essential (primary) hypertension: Secondary | ICD-10-CM | POA: Diagnosis not present

## 2017-03-23 DIAGNOSIS — M6281 Muscle weakness (generalized): Secondary | ICD-10-CM | POA: Diagnosis not present

## 2017-03-23 DIAGNOSIS — R404 Transient alteration of awareness: Secondary | ICD-10-CM | POA: Diagnosis not present

## 2017-03-23 DIAGNOSIS — R531 Weakness: Secondary | ICD-10-CM | POA: Diagnosis not present

## 2017-03-23 DIAGNOSIS — R2689 Other abnormalities of gait and mobility: Secondary | ICD-10-CM | POA: Diagnosis not present

## 2017-03-23 DIAGNOSIS — R29898 Other symptoms and signs involving the musculoskeletal system: Secondary | ICD-10-CM | POA: Diagnosis not present

## 2017-03-23 LAB — COMPREHENSIVE METABOLIC PANEL
ALBUMIN: 4.3 g/dL (ref 3.5–5.0)
ALK PHOS: 49 U/L (ref 38–126)
ALT: 21 U/L (ref 14–54)
AST: 30 U/L (ref 15–41)
Anion gap: 10 (ref 5–15)
BUN: 19 mg/dL (ref 6–20)
CALCIUM: 9.7 mg/dL (ref 8.9–10.3)
CO2: 28 mmol/L (ref 22–32)
Chloride: 101 mmol/L (ref 101–111)
Creatinine, Ser: 0.46 mg/dL (ref 0.44–1.00)
GFR calc Af Amer: >60 mL/min (ref >60)
GLUCOSE: 142 mg/dL — AB (ref 65–99)
Potassium: 3.9 mmol/L (ref 3.5–5.1)
Sodium: 139 mmol/L (ref 135–145)
TOTAL PROTEIN: 6.4 g/dL — AB (ref 6.5–8.1)
Total Bilirubin: 0.4 mg/dL (ref 0.3–1.2)

## 2017-03-23 LAB — CBC WITH DIFFERENTIAL/PLATELET
BASOS PCT: 0 %
Basophils Absolute: 0 10*3/uL (ref 0.0–0.1)
EOS ABS: 0 10*3/uL (ref 0.0–0.7)
EOS PCT: 0 %
HCT: 38.1 % (ref 36.0–46.0)
Hemoglobin: 12.8 g/dL (ref 12.0–15.0)
Lymphocytes Relative: 10 %
Lymphs Abs: 1.1 10*3/uL (ref 0.7–4.0)
MCH: 31.9 pg (ref 26.0–34.0)
MCHC: 33.6 g/dL (ref 30.0–36.0)
MCV: 95 fL (ref 78.0–100.0)
MONO ABS: 0.9 10*3/uL (ref 0.1–1.0)
Monocytes Relative: 8 %
Neutro Abs: 9.5 10*3/uL — ABNORMAL HIGH (ref 1.7–7.7)
Neutrophils Relative %: 82 %
PLATELETS: 207 10*3/uL (ref 150–400)
RBC: 4.01 MIL/uL (ref 3.87–5.11)
RDW: 12.8 % (ref 11.5–15.5)
WBC: 11.5 10*3/uL — AB (ref 4.0–10.5)

## 2017-03-23 LAB — LIPASE, BLOOD: Lipase: 28 U/L (ref 11–51)

## 2017-03-23 NOTE — ED Triage Notes (Signed)
Pt c/o epigastric pain, weakness and dizziness x 4 hours.

## 2017-03-24 ENCOUNTER — Emergency Department (HOSPITAL_BASED_OUTPATIENT_CLINIC_OR_DEPARTMENT_OTHER): Payer: PPO

## 2017-03-24 ENCOUNTER — Encounter (HOSPITAL_BASED_OUTPATIENT_CLINIC_OR_DEPARTMENT_OTHER): Payer: Self-pay | Admitting: Emergency Medicine

## 2017-03-24 DIAGNOSIS — R1013 Epigastric pain: Secondary | ICD-10-CM | POA: Diagnosis not present

## 2017-03-24 DIAGNOSIS — K7689 Other specified diseases of liver: Secondary | ICD-10-CM | POA: Diagnosis not present

## 2017-03-24 LAB — URINALYSIS, ROUTINE W REFLEX MICROSCOPIC
BILIRUBIN URINE: NEGATIVE
GLUCOSE, UA: NEGATIVE mg/dL
KETONES UR: NEGATIVE mg/dL
Nitrite: NEGATIVE
PROTEIN: NEGATIVE mg/dL
Specific Gravity, Urine: 1.015 (ref 1.005–1.030)
pH: 7 (ref 5.0–8.0)

## 2017-03-24 LAB — TROPONIN I: Troponin I: <0.03 ng/mL (ref <0.03)

## 2017-03-24 LAB — URINALYSIS, MICROSCOPIC (REFLEX)

## 2017-03-24 MED ORDER — SUCRALFATE 1 GM/10ML PO SUSP: 1.0000 g | Freq: Three times a day (TID) | ORAL | 0 refills | Status: DC

## 2017-03-24 MED ORDER — GI COCKTAIL ~~LOC~~
30.0000 mL | Freq: Once | ORAL | Status: AC
  Administered 2017-03-24: 30 mL via ORAL
  Filled 2017-03-24: qty 30

## 2017-03-24 MED ORDER — IOPAMIDOL (ISOVUE-300) INJECTION 61%
100.0000 mL | Freq: Once | INTRAVENOUS | Status: AC | PRN
  Administered 2017-03-24: 100 mL via INTRAVENOUS

## 2017-03-24 NOTE — ED Provider Notes (Signed)
[facility] EMERGENCY DEPARTMENT Provider Note   CSN: [id number] Arrival date & time: 03/23/17  2304     History   Chief Complaint Chief Complaint  Patient presents with  . Abdominal Pain    HPI Nina Rush is a 77 y.o. female.  The history is provided by the patient.  Abdominal Pain   This is a new problem. The current episode started 3 to 5 hours ago (after eating bean soup). The problem occurs constantly. The problem has not changed since onset.The pain is associated with eating. The pain is located in the epigastric region and LLQ. The quality of the pain is cramping. The pain is at a severity of 5/10. The pain is moderate. Associated symptoms include nausea. Pertinent negatives include anorexia, fever, belching, diarrhea, flatus, hematochezia, melena, vomiting, constipation, dysuria, frequency, hematuria, headaches, arthralgias and myalgias. Nothing aggravates the symptoms. Nothing relieves the symptoms. Past workup includes surgery. Past workup does not include GI consult. Her past medical history does not include PUD.    Past Medical History:  Diagnosis Date  . Charcot-Marie-Tooth disease   . Fuchs' adenoma    bilateral eyes  . Hypertension   . Lichen planus    scalp    There are no active problems to display for this patient.   Past Surgical History:  Procedure Laterality Date  . ABDOMINAL HYSTERECTOMY     total  . APPENDECTOMY    . CATARACT EXTRACTION Bilateral   . CHOLECYSTECTOMY    . LEG SURGERY     as child    OB History    No data available       Home Medications    Prior to Admission medications   Medication Sig Start Date End Date Taking? Authorizing Provider  ciprofloxacin (CIPRO) 500 MG tablet 500 mg daily. X 1 month 09/06/15   [provider]  clobetasol (TEMOVATE) 0.05 % external solution  04/27/13   [provider]  estradiol (ESTRACE) 0.1 MG/GM vaginal cream Apply pea size amount every other day as directed     [provider]  furosemide (LASIX) 20 MG tablet 20 mg daily. 10/18/15   [provider]  KLOR-CON M10 10 MEQ tablet 10 mEq. 10/18/15   [provider]  lisinopril (PRINIVIL,ZESTRIL) 10 MG tablet Take 10 mg by mouth daily.    [provider]  mirabegron ER (MYRBETRIQ) 25 MG TB24 tablet Take 25 mg by mouth daily.    [provider]  solifenacin (VESICARE) 10 MG tablet Take by mouth daily.    [provider]  sucralfate (CARAFATE) 1 GM/10ML suspension Take 10 mLs (1 g total) by mouth 4 (four) times daily -  with meals and at bedtime. 03/24/17   Indio Santilli, MD  vitamin C (ASCORBIC ACID) 500 MG tablet 500 mg.    [provider]    Family History Family History  Problem Relation Age of Onset  . Cancer - Lung Mother   . Stroke Father     Social History Social History   Tobacco Use  . Smoking status: Never Smoker  . Smokeless tobacco: Never Used  Substance Use Topics  . Alcohol use: No  . Drug use: No     Allergies   Penicillins and Sulfa antibiotics   Review of Systems Review of Systems  Constitutional: Negative for fever.  HENT: Negative for drooling and nosebleeds.   Respiratory: Negative for shortness of breath.   Cardiovascular: Negative for chest pain, palpitations and leg swelling.  Gastrointestinal: Positive for abdominal pain and nausea. Negative for anorexia, constipation, diarrhea, flatus, hematochezia, melena and vomiting.  Genitourinary: Negative for dysuria, frequency and hematuria.  Musculoskeletal: Negative for arthralgias and myalgias.  Neurological: Negative for headaches.  All other systems reviewed and are negative.    Physical Exam Updated Vital Signs BP 115/64 (BP Location: Right Arm)   Pulse 80   Temp 98.2 F (36.8 C) (Oral)   Resp 20   Ht 5\' 3"  (1.6 m)   Wt 54 kg (119 lb)   SpO2 96%   BMI 21.08 kg/m   Physical Exam  Constitutional: She is oriented to person, place, and time. She  appears well-developed and well-nourished.  Non-toxic appearance. She does not appear ill. No distress.  HENT:  Head: Normocephalic and atraumatic.  Mouth/Throat: No oropharyngeal exudate.  Eyes: Pupils are equal, round, and reactive to light.  Cardiovascular: Normal rate, regular rhythm, normal heart sounds and intact distal pulses.  Pulmonary/Chest: Breath sounds normal. No respiratory distress.  Abdominal: Soft. Normal appearance. She exhibits no shifting dullness, no distension, no pulsatile liver, no fluid wave, no abdominal bruit, no ascites, no pulsatile midline mass and no mass. Bowel sounds are increased. There is no hepatosplenomegaly, splenomegaly or hepatomegaly. There is no tenderness. There is no rigidity, no rebound, no guarding, no CVA tenderness, no tenderness at McBurney's point and negative Murphy's sign. No hernia.  Neurological: She is alert and oriented to person, place, and time.  Skin: Skin is warm and dry. Capillary refill takes less than 2 seconds.  Psychiatric: She has a normal mood and affect.  Nursing note and vitals reviewed.    ED Treatments / Results  Labs (all labs ordered are listed, but only abnormal results are displayed)  Results for orders placed or performed during the hospital encounter of 03/23/17  Lipase, blood  Result Value Ref Range   Lipase 28 11 - 51 U/L  Comprehensive metabolic panel  Result Value Ref Range   Sodium 139 135 - 145 mmol/L   Potassium 3.9 3.5 - 5.1 mmol/L   Chloride 101 101 - 111 mmol/L   CO2 28 22 - 32 mmol/L   Glucose, Bld 142 (H) 65 - 99 mg/dL   BUN 19 6 - 20 mg/dL   Creatinine, Ser 0.46 0.44 - 1.00 mg/dL   Calcium 9.7 8.9 - 10.3 mg/dL   Total Protein 6.4 (L) 6.5 - 8.1 g/dL   Albumin 4.3 3.5 - 5.0 g/dL   AST 30 15 - 41 U/L   ALT 21 14 - 54 U/L   Alkaline Phosphatase 49 38 - 126 U/L   Total Bilirubin 0.4 0.3 - 1.2 mg/dL   GFR calc non Af Amer >60 >60 mL/min   GFR calc Af Amer >60 >60 mL/min   Anion gap 10 5 - 15    CBC with Differential  Result Value Ref Range   WBC 11.5 (H) 4.0 - 10.5 K/uL   RBC 4.01 3.87 - 5.11 MIL/uL   Hemoglobin 12.8 12.0 - 15.0 g/dL   HCT 38.1 36.0 - 46.0 %   MCV 95.0 78.0 - 100.0 fL   MCH 31.9 26.0 - 34.0 pg   MCHC 33.6 30.0 - 36.0 g/dL   RDW 12.8 11.5 - 15.5 %   Platelets 207 150 - 400 K/uL   Neutrophils Relative % 82 %   Neutro Abs 9.5 (H) 1.7 - 7.7 K/uL   Lymphocytes Relative 10 %   Lymphs Abs 1.1 0.7 - 4.0 K/uL  Monocytes Relative 8 %   Monocytes Absolute 0.9 0.1 - 1.0 K/uL   Eosinophils Relative 0 %   Eosinophils Absolute 0.0 0.0 - 0.7 K/uL   Basophils Relative 0 %   Basophils Absolute 0.0 0.0 - 0.1 K/uL  Troponin I  Result Value Ref Range   Troponin I <0.03 <0.03 ng/mL  Urinalysis, Routine w reflex microscopic  Result Value Ref Range   Color, Urine YELLOW YELLOW   APPearance CLEAR CLEAR   Specific Gravity, Urine 1.015 1.005 - 1.030   pH 7.0 5.0 - 8.0   Glucose, UA NEGATIVE NEGATIVE mg/dL   Hgb urine dipstick SMALL (A) NEGATIVE   Bilirubin Urine NEGATIVE NEGATIVE   Ketones, ur NEGATIVE NEGATIVE mg/dL   Protein, ur NEGATIVE NEGATIVE mg/dL   Nitrite NEGATIVE NEGATIVE   Leukocytes, UA SMALL (A) NEGATIVE  Urinalysis, Microscopic (reflex)  Result Value Ref Range   RBC / HPF 0-5 0 - 5 RBC/hpf   WBC, UA 0-5 0 - 5 WBC/hpf   Bacteria, UA FEW (A) NONE SEEN   Squamous Epithelial / LPF 6-30 (A) NONE SEEN   Mucus PRESENT   Troponin I  Result Value Ref Range   Troponin I <0.03 <0.03 ng/mL   Dg Chest 2 View  Result Date: 03/24/2017 CLINICAL DATA:  Epigastric pain. EXAM: CHEST  2 VIEW COMPARISON:  Radiograph [id number] FINDINGS: The cardiomediastinal contours are normal. The lungs are clear. Pulmonary vasculature is normal. No consolidation, pleural effusion, or pneumothorax. No acute osseous abnormalities are seen. IMPRESSION: No acute pulmonary process. Electronically Signed   By: Jeb Levering M.D.   On: 03/24/2017 02:08   Ct Abdomen Pelvis W  Contrast  Result Date: 03/24/2017 CLINICAL DATA:  Epigastric pain for 5 hours. EXAM: CT ABDOMEN AND PELVIS WITH CONTRAST TECHNIQUE: Multidetector CT imaging of the abdomen and pelvis was performed using the standard protocol following bolus administration of intravenous contrast. CONTRAST:  135mL ISOVUE-300 IOPAMIDOL (ISOVUE-300) INJECTION 61% COMPARISON:  CT 10/16/2015 FINDINGS: Lower chest: Scattered lung base atelectasis. No pleural fluid. Small diaphragmatic nodule in the right lower lobe is unchanged dating back to 2015 and considered benign. Hepatobiliary: Multiple hepatic cysts are unchanged from prior exam, largest lesion in the right hepatic dome measures approximately 3.8 cm. Postcholecystectomy with unchanged biliary dilatation. Common bile duct measures 14 mm at the porta hepatis with normal tapering to the duodenal insertion. Pancreas: No ductal dilatation or inflammation. Spleen: Normal in size without focal abnormality. Adrenals/Urinary Tract: No adrenal nodule. No hydronephrosis or perinephric edema. Homogeneous renal enhancement with symmetric excretion on delayed phase imaging. Small simple cysts in both kidneys are again seen. Urinary bladder is physiologically distended without wall thickening. Stomach/Bowel: Stomach is physiologically distended. No bowel inflammation, wall thickening or obstruction. There is mild pelvic floor descent with small-bowel enterocele. Appendix not visualized, surgically absent per history. Moderate colonic stool burden with transverse colonic tortuosity. No colonic wall thickening. Minimal sigmoid diverticulosis without diverticulitis. Vascular/Lymphatic: Moderate aortic atherosclerosis without aneurysm. No enlarged abdominal or pelvic lymph nodes. Reproductive: Status post hysterectomy. No adnexal masses. Other: No free air, free fluid, or intra-abdominal fluid collection. Musculoskeletal: There are no acute or suspicious osseous abnormalities. Multilevel  degenerative change in the spine. IMPRESSION: 1. No acute abnormality or explanation for epigastric pain. 2. Mild floor descent with enterocele, no bowel obstruction or inflammation. 3.  Aortic Atherosclerosis (ICD10-I70.0). Electronically Signed   By: Jeb Levering M.D.   On: 03/24/2017 02:07    EKG  EKG Interpretation  Date/Time:  Tuesday March 23 2017 23:21:36 EST Ventricular Rate:  79 PR Interval:    QRS Duration: 100 QT Interval:  399 QTC Calculation: 458 R Axis:   54 Text Interpretation:  Sinus rhythm Confirmed by Dory Horn) on 03/24/2017 3:05:08 AM       Radiology Dg Chest 2 View  Result Date: 03/24/2017 CLINICAL DATA:  Epigastric pain. EXAM: CHEST  2 VIEW COMPARISON:  Radiograph [id number] FINDINGS: The cardiomediastinal contours are normal. The lungs are clear. Pulmonary vasculature is normal. No consolidation, pleural effusion, or pneumothorax. No acute osseous abnormalities are seen. IMPRESSION: No acute pulmonary process. Electronically Signed   By: Jeb Levering M.D.   On: 03/24/2017 02:08   Ct Abdomen Pelvis W Contrast  Result Date: 03/24/2017 CLINICAL DATA:  Epigastric pain for 5 hours. EXAM: CT ABDOMEN AND PELVIS WITH CONTRAST TECHNIQUE: Multidetector CT imaging of the abdomen and pelvis was performed using the standard protocol following bolus administration of intravenous contrast. CONTRAST:  14mL ISOVUE-300 IOPAMIDOL (ISOVUE-300) INJECTION 61% COMPARISON:  CT 10/16/2015 FINDINGS: Lower chest: Scattered lung base atelectasis. No pleural fluid. Small diaphragmatic nodule in the right lower lobe is unchanged dating back to 2015 and considered benign. Hepatobiliary: Multiple hepatic cysts are unchanged from prior exam, largest lesion in the right hepatic dome measures approximately 3.8 cm. Postcholecystectomy with unchanged biliary dilatation. Common bile duct measures 14 mm at the porta hepatis with normal tapering to the duodenal insertion. Pancreas: No  ductal dilatation or inflammation. Spleen: Normal in size without focal abnormality. Adrenals/Urinary Tract: No adrenal nodule. No hydronephrosis or perinephric edema. Homogeneous renal enhancement with symmetric excretion on delayed phase imaging. Small simple cysts in both kidneys are again seen. Urinary bladder is physiologically distended without wall thickening. Stomach/Bowel: Stomach is physiologically distended. No bowel inflammation, wall thickening or obstruction. There is mild pelvic floor descent with small-bowel enterocele. Appendix not visualized, surgically absent per history. Moderate colonic stool burden with transverse colonic tortuosity. No colonic wall thickening. Minimal sigmoid diverticulosis without diverticulitis. Vascular/Lymphatic: Moderate aortic atherosclerosis without aneurysm. No enlarged abdominal or pelvic lymph nodes. Reproductive: Status post hysterectomy. No adnexal masses. Other: No free air, free fluid, or intra-abdominal fluid collection. Musculoskeletal: There are no acute or suspicious osseous abnormalities. Multilevel degenerative change in the spine. IMPRESSION: 1. No acute abnormality or explanation for epigastric pain. 2. Mild floor descent with enterocele, no bowel obstruction or inflammation. 3.  Aortic Atherosclerosis (ICD10-I70.0). Electronically Signed   By: Jeb Levering M.D.   On: 03/24/2017 02:07    Procedures Procedures (including critical care time)  Medications Ordered in ED Medications  gi cocktail (Maalox,Lidocaine,Donnatal) (30 mLs Oral Given 03/24/17 0115)  iopamidol (ISOVUE-300) 61 % injection 100 mL (100 mLs Intravenous Contrast Given 03/24/17 0146)    Orthostatic VS for the past 24 hrs:  BP- Lying Pulse- Lying BP- Sitting Pulse- Sitting BP- Standing at 0 minutes Pulse- Standing at 0 minutes  03/24/17 0021 126/61 86 137/61 83 116/54 72     Well appearing with benign exam and labs and imaging.    Final Clinical Impressions(s) / ED  Diagnoses   Final diagnoses:  Abdominal cramping   I suspect that she had cramping and gas from the bean soup.  Ruled out for MI and all lifethreatening illnesses in the ED.    Return for weakness, numbness, changes in vision or speech,  fevers > 100.4 unrelieved by medication, shortness of breath, intractable vomiting, or diarrhea, abdominal pain, Inability to tolerate liquids or food, cough, altered mental status or any concerns.  No signs of systemic illness or infection. The patient is nontoxic-appearing on exam and vital signs are within normal limits.    I have reviewed the triage vital signs and the nursing notes. Pertinent labs &imaging results that were available during my care of the patient were reviewed by me and considered in my medical decision making (see chart for details).  After history, exam, and medical workup I feel the patient has been appropriately medically screened and is safe for discharge home. Pertinent diagnoses were discussed with the patient. Patient was given return precautions. ED Discharge Orders        Ordered    sucralfate (CARAFATE) 1 GM/10ML suspension  3 times daily with meals & bedtime     03/24/17 0315       Karuna Balducci, MD 03/24/17 6153

## 2017-03-24 NOTE — ED Notes (Signed)
PT discharged to home with husband. NAD

## 2017-03-26 DIAGNOSIS — R2689 Other abnormalities of gait and mobility: Secondary | ICD-10-CM | POA: Diagnosis not present

## 2017-03-26 DIAGNOSIS — R29898 Other symptoms and signs involving the musculoskeletal system: Secondary | ICD-10-CM | POA: Diagnosis not present

## 2017-03-26 DIAGNOSIS — M6281 Muscle weakness (generalized): Secondary | ICD-10-CM | POA: Diagnosis not present

## 2017-03-26 DIAGNOSIS — G600 Hereditary motor and sensory neuropathy: Secondary | ICD-10-CM | POA: Diagnosis not present

## 2017-03-26 DIAGNOSIS — R2681 Unsteadiness on feet: Secondary | ICD-10-CM | POA: Diagnosis not present

## 2017-03-31 DIAGNOSIS — M6281 Muscle weakness (generalized): Secondary | ICD-10-CM | POA: Diagnosis not present

## 2017-03-31 DIAGNOSIS — R2689 Other abnormalities of gait and mobility: Secondary | ICD-10-CM | POA: Diagnosis not present

## 2017-03-31 DIAGNOSIS — G600 Hereditary motor and sensory neuropathy: Secondary | ICD-10-CM | POA: Diagnosis not present

## 2017-03-31 DIAGNOSIS — R2681 Unsteadiness on feet: Secondary | ICD-10-CM | POA: Diagnosis not present

## 2017-03-31 DIAGNOSIS — R29898 Other symptoms and signs involving the musculoskeletal system: Secondary | ICD-10-CM | POA: Diagnosis not present

## 2017-04-02 DIAGNOSIS — M6281 Muscle weakness (generalized): Secondary | ICD-10-CM | POA: Diagnosis not present

## 2017-04-02 DIAGNOSIS — R2681 Unsteadiness on feet: Secondary | ICD-10-CM | POA: Diagnosis not present

## 2017-04-02 DIAGNOSIS — R29898 Other symptoms and signs involving the musculoskeletal system: Secondary | ICD-10-CM | POA: Diagnosis not present

## 2017-04-02 DIAGNOSIS — R2689 Other abnormalities of gait and mobility: Secondary | ICD-10-CM | POA: Diagnosis not present

## 2017-04-02 DIAGNOSIS — G600 Hereditary motor and sensory neuropathy: Secondary | ICD-10-CM | POA: Diagnosis not present

## 2017-04-06 DIAGNOSIS — N3946 Mixed incontinence: Secondary | ICD-10-CM | POA: Diagnosis not present

## 2017-04-07 DIAGNOSIS — R29898 Other symptoms and signs involving the musculoskeletal system: Secondary | ICD-10-CM | POA: Diagnosis not present

## 2017-04-07 DIAGNOSIS — G600 Hereditary motor and sensory neuropathy: Secondary | ICD-10-CM | POA: Diagnosis not present

## 2017-04-07 DIAGNOSIS — M6281 Muscle weakness (generalized): Secondary | ICD-10-CM | POA: Diagnosis not present

## 2017-04-07 DIAGNOSIS — R2689 Other abnormalities of gait and mobility: Secondary | ICD-10-CM | POA: Diagnosis not present

## 2017-04-07 DIAGNOSIS — R2681 Unsteadiness on feet: Secondary | ICD-10-CM | POA: Diagnosis not present

## 2017-04-12 DIAGNOSIS — R2689 Other abnormalities of gait and mobility: Secondary | ICD-10-CM | POA: Diagnosis not present

## 2017-04-12 DIAGNOSIS — M6281 Muscle weakness (generalized): Secondary | ICD-10-CM | POA: Diagnosis not present

## 2017-04-12 DIAGNOSIS — G600 Hereditary motor and sensory neuropathy: Secondary | ICD-10-CM | POA: Diagnosis not present

## 2017-04-12 DIAGNOSIS — R2681 Unsteadiness on feet: Secondary | ICD-10-CM | POA: Diagnosis not present

## 2017-04-12 DIAGNOSIS — R29898 Other symptoms and signs involving the musculoskeletal system: Secondary | ICD-10-CM | POA: Diagnosis not present

## 2017-04-14 DIAGNOSIS — R29898 Other symptoms and signs involving the musculoskeletal system: Secondary | ICD-10-CM | POA: Diagnosis not present

## 2017-04-14 DIAGNOSIS — R2689 Other abnormalities of gait and mobility: Secondary | ICD-10-CM | POA: Diagnosis not present

## 2017-04-14 DIAGNOSIS — M6281 Muscle weakness (generalized): Secondary | ICD-10-CM | POA: Diagnosis not present

## 2017-04-14 DIAGNOSIS — G600 Hereditary motor and sensory neuropathy: Secondary | ICD-10-CM | POA: Diagnosis not present

## 2017-04-14 DIAGNOSIS — R2681 Unsteadiness on feet: Secondary | ICD-10-CM | POA: Diagnosis not present

## 2017-04-27 DIAGNOSIS — M810 Age-related osteoporosis without current pathological fracture: Secondary | ICD-10-CM | POA: Diagnosis not present

## 2017-04-30 DIAGNOSIS — R2689 Other abnormalities of gait and mobility: Secondary | ICD-10-CM | POA: Diagnosis not present

## 2017-04-30 DIAGNOSIS — G600 Hereditary motor and sensory neuropathy: Secondary | ICD-10-CM | POA: Diagnosis not present

## 2017-04-30 DIAGNOSIS — R29898 Other symptoms and signs involving the musculoskeletal system: Secondary | ICD-10-CM | POA: Diagnosis not present

## 2017-04-30 DIAGNOSIS — R2681 Unsteadiness on feet: Secondary | ICD-10-CM | POA: Diagnosis not present

## 2017-04-30 DIAGNOSIS — M6281 Muscle weakness (generalized): Secondary | ICD-10-CM | POA: Diagnosis not present

## 2017-05-04 DIAGNOSIS — G600 Hereditary motor and sensory neuropathy: Secondary | ICD-10-CM | POA: Diagnosis not present

## 2017-05-04 DIAGNOSIS — R2681 Unsteadiness on feet: Secondary | ICD-10-CM | POA: Diagnosis not present

## 2017-05-04 DIAGNOSIS — R29898 Other symptoms and signs involving the musculoskeletal system: Secondary | ICD-10-CM | POA: Diagnosis not present

## 2017-05-04 DIAGNOSIS — M6281 Muscle weakness (generalized): Secondary | ICD-10-CM | POA: Diagnosis not present

## 2017-05-04 DIAGNOSIS — R2689 Other abnormalities of gait and mobility: Secondary | ICD-10-CM | POA: Diagnosis not present

## 2017-05-06 DIAGNOSIS — R2689 Other abnormalities of gait and mobility: Secondary | ICD-10-CM | POA: Diagnosis not present

## 2017-05-06 DIAGNOSIS — R29898 Other symptoms and signs involving the musculoskeletal system: Secondary | ICD-10-CM | POA: Diagnosis not present

## 2017-05-06 DIAGNOSIS — R2681 Unsteadiness on feet: Secondary | ICD-10-CM | POA: Diagnosis not present

## 2017-05-06 DIAGNOSIS — M6281 Muscle weakness (generalized): Secondary | ICD-10-CM | POA: Diagnosis not present

## 2017-05-06 DIAGNOSIS — G600 Hereditary motor and sensory neuropathy: Secondary | ICD-10-CM | POA: Diagnosis not present

## 2017-05-17 DIAGNOSIS — L661 Lichen planopilaris: Secondary | ICD-10-CM | POA: Diagnosis not present

## 2017-05-17 DIAGNOSIS — L821 Other seborrheic keratosis: Secondary | ICD-10-CM | POA: Diagnosis not present

## 2017-05-18 DIAGNOSIS — G600 Hereditary motor and sensory neuropathy: Secondary | ICD-10-CM | POA: Diagnosis not present

## 2017-05-18 DIAGNOSIS — Z880 Allergy status to penicillin: Secondary | ICD-10-CM | POA: Diagnosis not present

## 2017-05-18 DIAGNOSIS — Z882 Allergy status to sulfonamides status: Secondary | ICD-10-CM | POA: Diagnosis not present

## 2017-05-18 DIAGNOSIS — I10 Essential (primary) hypertension: Secondary | ICD-10-CM | POA: Diagnosis not present

## 2017-05-18 DIAGNOSIS — E785 Hyperlipidemia, unspecified: Secondary | ICD-10-CM | POA: Diagnosis not present

## 2017-05-18 DIAGNOSIS — G629 Polyneuropathy, unspecified: Secondary | ICD-10-CM | POA: Diagnosis not present

## 2017-05-26 DIAGNOSIS — H57813 Brow ptosis, bilateral: Secondary | ICD-10-CM | POA: Diagnosis not present

## 2017-05-26 DIAGNOSIS — H02403 Unspecified ptosis of bilateral eyelids: Secondary | ICD-10-CM | POA: Diagnosis not present

## 2017-06-02 DIAGNOSIS — M2041 Other hammer toe(s) (acquired), right foot: Secondary | ICD-10-CM | POA: Diagnosis not present

## 2017-06-02 DIAGNOSIS — M2042 Other hammer toe(s) (acquired), left foot: Secondary | ICD-10-CM | POA: Diagnosis not present

## 2017-06-24 DIAGNOSIS — I10 Essential (primary) hypertension: Secondary | ICD-10-CM | POA: Diagnosis not present

## 2017-06-24 DIAGNOSIS — R7301 Impaired fasting glucose: Secondary | ICD-10-CM | POA: Diagnosis not present

## 2017-06-24 DIAGNOSIS — E785 Hyperlipidemia, unspecified: Secondary | ICD-10-CM | POA: Diagnosis not present

## 2017-08-16 DIAGNOSIS — L720 Epidermal cyst: Secondary | ICD-10-CM | POA: Diagnosis not present

## 2017-08-16 DIAGNOSIS — L661 Lichen planopilaris: Secondary | ICD-10-CM | POA: Diagnosis not present

## 2017-08-16 DIAGNOSIS — L821 Other seborrheic keratosis: Secondary | ICD-10-CM | POA: Diagnosis not present

## 2017-08-31 DIAGNOSIS — R5383 Other fatigue: Secondary | ICD-10-CM | POA: Diagnosis not present

## 2017-08-31 DIAGNOSIS — R079 Chest pain, unspecified: Secondary | ICD-10-CM | POA: Diagnosis not present

## 2017-08-31 DIAGNOSIS — E785 Hyperlipidemia, unspecified: Secondary | ICD-10-CM | POA: Diagnosis not present

## 2017-09-02 DIAGNOSIS — E785 Hyperlipidemia, unspecified: Secondary | ICD-10-CM | POA: Diagnosis not present

## 2017-09-02 DIAGNOSIS — R5383 Other fatigue: Secondary | ICD-10-CM | POA: Diagnosis not present

## 2017-09-15 DIAGNOSIS — H02403 Unspecified ptosis of bilateral eyelids: Secondary | ICD-10-CM | POA: Diagnosis not present

## 2017-10-05 DIAGNOSIS — R3129 Other microscopic hematuria: Secondary | ICD-10-CM | POA: Diagnosis not present

## 2017-10-05 DIAGNOSIS — N3946 Mixed incontinence: Secondary | ICD-10-CM | POA: Diagnosis not present

## 2017-10-13 DIAGNOSIS — L669 Cicatricial alopecia, unspecified: Secondary | ICD-10-CM | POA: Diagnosis not present

## 2017-10-13 DIAGNOSIS — L661 Lichen planopilaris: Secondary | ICD-10-CM | POA: Diagnosis not present

## 2017-10-25 DIAGNOSIS — E785 Hyperlipidemia, unspecified: Secondary | ICD-10-CM | POA: Diagnosis not present

## 2017-10-25 DIAGNOSIS — I10 Essential (primary) hypertension: Secondary | ICD-10-CM | POA: Diagnosis not present

## 2017-10-25 DIAGNOSIS — R7301 Impaired fasting glucose: Secondary | ICD-10-CM | POA: Diagnosis not present

## 2017-10-29 DIAGNOSIS — R7301 Impaired fasting glucose: Secondary | ICD-10-CM | POA: Diagnosis not present

## 2017-10-29 DIAGNOSIS — E785 Hyperlipidemia, unspecified: Secondary | ICD-10-CM | POA: Diagnosis not present

## 2017-10-29 DIAGNOSIS — I10 Essential (primary) hypertension: Secondary | ICD-10-CM | POA: Diagnosis not present

## 2017-11-10 DIAGNOSIS — M2041 Other hammer toe(s) (acquired), right foot: Secondary | ICD-10-CM | POA: Diagnosis not present

## 2017-11-10 DIAGNOSIS — M2042 Other hammer toe(s) (acquired), left foot: Secondary | ICD-10-CM | POA: Diagnosis not present

## 2017-11-10 DIAGNOSIS — M810 Age-related osteoporosis without current pathological fracture: Secondary | ICD-10-CM | POA: Diagnosis not present

## 2017-11-15 DIAGNOSIS — H04123 Dry eye syndrome of bilateral lacrimal glands: Secondary | ICD-10-CM | POA: Diagnosis not present

## 2017-11-15 DIAGNOSIS — H1851 Endothelial corneal dystrophy: Secondary | ICD-10-CM | POA: Diagnosis not present

## 2017-11-15 DIAGNOSIS — Z961 Presence of intraocular lens: Secondary | ICD-10-CM | POA: Diagnosis not present

## 2017-11-22 DIAGNOSIS — N3946 Mixed incontinence: Secondary | ICD-10-CM | POA: Diagnosis not present

## 2017-11-26 DIAGNOSIS — Z23 Encounter for immunization: Secondary | ICD-10-CM | POA: Diagnosis not present

## 2017-11-26 DIAGNOSIS — H00015 Hordeolum externum left lower eyelid: Secondary | ICD-10-CM | POA: Diagnosis not present

## 2017-11-30 DIAGNOSIS — N3941 Urge incontinence: Secondary | ICD-10-CM | POA: Diagnosis not present

## 2017-11-30 DIAGNOSIS — R319 Hematuria, unspecified: Secondary | ICD-10-CM | POA: Diagnosis not present

## 2017-11-30 DIAGNOSIS — K7689 Other specified diseases of liver: Secondary | ICD-10-CM | POA: Diagnosis not present

## 2017-11-30 DIAGNOSIS — N390 Urinary tract infection, site not specified: Secondary | ICD-10-CM | POA: Diagnosis not present

## 2017-11-30 DIAGNOSIS — R3129 Other microscopic hematuria: Secondary | ICD-10-CM | POA: Diagnosis not present

## 2017-11-30 DIAGNOSIS — K838 Other specified diseases of biliary tract: Secondary | ICD-10-CM | POA: Diagnosis not present

## 2017-11-30 DIAGNOSIS — N3592 Unspecified urethral stricture, female: Secondary | ICD-10-CM | POA: Diagnosis not present

## 2017-12-21 DIAGNOSIS — G600 Hereditary motor and sensory neuropathy: Secondary | ICD-10-CM | POA: Diagnosis not present

## 2018-01-13 DIAGNOSIS — I10 Essential (primary) hypertension: Secondary | ICD-10-CM | POA: Diagnosis not present

## 2018-01-13 DIAGNOSIS — Z0000 Encounter for general adult medical examination without abnormal findings: Secondary | ICD-10-CM | POA: Diagnosis not present

## 2018-01-13 DIAGNOSIS — E785 Hyperlipidemia, unspecified: Secondary | ICD-10-CM | POA: Diagnosis not present

## 2018-01-18 DIAGNOSIS — K219 Gastro-esophageal reflux disease without esophagitis: Secondary | ICD-10-CM | POA: Diagnosis not present

## 2018-01-18 DIAGNOSIS — I10 Essential (primary) hypertension: Secondary | ICD-10-CM | POA: Diagnosis not present

## 2018-01-18 DIAGNOSIS — Z0000 Encounter for general adult medical examination without abnormal findings: Secondary | ICD-10-CM | POA: Diagnosis not present

## 2018-01-18 DIAGNOSIS — E785 Hyperlipidemia, unspecified: Secondary | ICD-10-CM | POA: Diagnosis not present

## 2018-01-18 DIAGNOSIS — M810 Age-related osteoporosis without current pathological fracture: Secondary | ICD-10-CM | POA: Diagnosis not present

## 2018-01-18 DIAGNOSIS — R7301 Impaired fasting glucose: Secondary | ICD-10-CM | POA: Diagnosis not present

## 2018-01-26 DIAGNOSIS — N3090 Cystitis, unspecified without hematuria: Secondary | ICD-10-CM | POA: Diagnosis not present

## 2018-01-26 DIAGNOSIS — R300 Dysuria: Secondary | ICD-10-CM | POA: Diagnosis not present

## 2018-02-21 DIAGNOSIS — Z08 Encounter for follow-up examination after completed treatment for malignant neoplasm: Secondary | ICD-10-CM | POA: Diagnosis not present

## 2018-02-21 DIAGNOSIS — L439 Lichen planus, unspecified: Secondary | ICD-10-CM | POA: Diagnosis not present

## 2018-02-21 DIAGNOSIS — L570 Actinic keratosis: Secondary | ICD-10-CM | POA: Diagnosis not present

## 2018-02-21 DIAGNOSIS — Z85828 Personal history of other malignant neoplasm of skin: Secondary | ICD-10-CM | POA: Diagnosis not present

## 2018-03-16 DIAGNOSIS — Z1231 Encounter for screening mammogram for malignant neoplasm of breast: Secondary | ICD-10-CM | POA: Diagnosis not present

## 2018-03-21 DIAGNOSIS — N390 Urinary tract infection, site not specified: Secondary | ICD-10-CM | POA: Diagnosis not present

## 2018-03-31 DIAGNOSIS — H018 Other specified inflammations of eyelid: Secondary | ICD-10-CM | POA: Diagnosis not present

## 2018-03-31 DIAGNOSIS — H04123 Dry eye syndrome of bilateral lacrimal glands: Secondary | ICD-10-CM | POA: Diagnosis not present

## 2018-04-20 DIAGNOSIS — N390 Urinary tract infection, site not specified: Secondary | ICD-10-CM | POA: Diagnosis not present

## 2018-05-18 DIAGNOSIS — C775 Secondary and unspecified malignant neoplasm of intrapelvic lymph nodes: Secondary | ICD-10-CM | POA: Diagnosis not present

## 2018-05-18 DIAGNOSIS — Z23 Encounter for immunization: Secondary | ICD-10-CM | POA: Diagnosis not present

## 2018-05-18 DIAGNOSIS — M810 Age-related osteoporosis without current pathological fracture: Secondary | ICD-10-CM | POA: Diagnosis not present

## 2018-05-18 DIAGNOSIS — Z01818 Encounter for other preprocedural examination: Secondary | ICD-10-CM | POA: Diagnosis not present

## 2018-05-18 DIAGNOSIS — M545 Low back pain: Secondary | ICD-10-CM | POA: Diagnosis not present

## 2018-05-18 DIAGNOSIS — J449 Chronic obstructive pulmonary disease, unspecified: Secondary | ICD-10-CM | POA: Diagnosis not present

## 2018-05-18 DIAGNOSIS — E222 Syndrome of inappropriate secretion of antidiuretic hormone: Secondary | ICD-10-CM | POA: Diagnosis not present

## 2018-05-18 DIAGNOSIS — Z0000 Encounter for general adult medical examination without abnormal findings: Secondary | ICD-10-CM | POA: Diagnosis not present

## 2018-05-18 DIAGNOSIS — K219 Gastro-esophageal reflux disease without esophagitis: Secondary | ICD-10-CM | POA: Diagnosis not present

## 2018-05-18 DIAGNOSIS — I700 Atherosclerosis of aorta: Secondary | ICD-10-CM | POA: Diagnosis not present

## 2018-05-18 DIAGNOSIS — D649 Anemia, unspecified: Secondary | ICD-10-CM | POA: Diagnosis not present

## 2018-05-18 DIAGNOSIS — I10 Essential (primary) hypertension: Secondary | ICD-10-CM | POA: Diagnosis not present

## 2018-05-18 DIAGNOSIS — C61 Malignant neoplasm of prostate: Secondary | ICD-10-CM | POA: Diagnosis not present

## 2018-05-18 DIAGNOSIS — E785 Hyperlipidemia, unspecified: Secondary | ICD-10-CM | POA: Diagnosis not present

## 2018-05-18 DIAGNOSIS — J189 Pneumonia, unspecified organism: Secondary | ICD-10-CM | POA: Diagnosis not present

## 2018-05-18 DIAGNOSIS — E119 Type 2 diabetes mellitus without complications: Secondary | ICD-10-CM | POA: Diagnosis not present

## 2018-05-18 DIAGNOSIS — F17210 Nicotine dependence, cigarettes, uncomplicated: Secondary | ICD-10-CM | POA: Diagnosis not present

## 2018-05-18 DIAGNOSIS — R7301 Impaired fasting glucose: Secondary | ICD-10-CM | POA: Diagnosis not present

## 2018-05-18 DIAGNOSIS — Z1231 Encounter for screening mammogram for malignant neoplasm of breast: Secondary | ICD-10-CM | POA: Diagnosis not present

## 2018-05-18 DIAGNOSIS — F320 Major depressive disorder, single episode, mild: Secondary | ICD-10-CM | POA: Diagnosis not present

## 2018-05-18 DIAGNOSIS — E7800 Pure hypercholesterolemia, unspecified: Secondary | ICD-10-CM | POA: Diagnosis not present

## 2018-05-24 DIAGNOSIS — R7301 Impaired fasting glucose: Secondary | ICD-10-CM | POA: Diagnosis not present

## 2018-05-24 DIAGNOSIS — E785 Hyperlipidemia, unspecified: Secondary | ICD-10-CM | POA: Diagnosis not present

## 2018-05-24 DIAGNOSIS — I10 Essential (primary) hypertension: Secondary | ICD-10-CM | POA: Diagnosis not present

## 2018-06-15 DIAGNOSIS — R072 Precordial pain: Secondary | ICD-10-CM | POA: Diagnosis not present

## 2018-06-15 DIAGNOSIS — G600 Hereditary motor and sensory neuropathy: Secondary | ICD-10-CM | POA: Diagnosis not present

## 2018-06-15 DIAGNOSIS — M7989 Other specified soft tissue disorders: Secondary | ICD-10-CM | POA: Diagnosis not present

## 2018-06-22 DIAGNOSIS — M7989 Other specified soft tissue disorders: Secondary | ICD-10-CM | POA: Diagnosis not present

## 2018-06-28 DIAGNOSIS — L600 Ingrowing nail: Secondary | ICD-10-CM | POA: Diagnosis not present

## 2018-06-28 DIAGNOSIS — L603 Nail dystrophy: Secondary | ICD-10-CM | POA: Diagnosis not present

## 2018-07-10 ENCOUNTER — Other Ambulatory Visit: Payer: Self-pay

## 2018-07-10 ENCOUNTER — Emergency Department (HOSPITAL_BASED_OUTPATIENT_CLINIC_OR_DEPARTMENT_OTHER): Payer: PPO

## 2018-07-10 ENCOUNTER — Inpatient Hospital Stay (HOSPITAL_BASED_OUTPATIENT_CLINIC_OR_DEPARTMENT_OTHER)
Admission: EM | Admit: 2018-07-10 | Discharge: 2018-07-12 | DRG: 287 | Disposition: A | Discharge status: Home / Self Care | Payer: PPO | Attending: Internal Medicine | Admitting: Internal Medicine

## 2018-07-10 ENCOUNTER — Encounter (HOSPITAL_BASED_OUTPATIENT_CLINIC_OR_DEPARTMENT_OTHER): Payer: Self-pay | Admitting: Emergency Medicine

## 2018-07-10 DIAGNOSIS — Z823 Family history of stroke: Secondary | ICD-10-CM

## 2018-07-10 DIAGNOSIS — R072 Precordial pain: Secondary | ICD-10-CM | POA: Diagnosis present

## 2018-07-10 DIAGNOSIS — R600 Localized edema: Secondary | ICD-10-CM | POA: Diagnosis present

## 2018-07-10 DIAGNOSIS — Z9841 Cataract extraction status, right eye: Secondary | ICD-10-CM

## 2018-07-10 DIAGNOSIS — Z881 Allergy status to other antibiotic agents status: Secondary | ICD-10-CM

## 2018-07-10 DIAGNOSIS — Z79899 Other long term (current) drug therapy: Secondary | ICD-10-CM | POA: Diagnosis not present

## 2018-07-10 DIAGNOSIS — Z8249 Family history of ischemic heart disease and other diseases of the circulatory system: Secondary | ICD-10-CM

## 2018-07-10 DIAGNOSIS — Z90710 Acquired absence of both cervix and uterus: Secondary | ICD-10-CM | POA: Diagnosis not present

## 2018-07-10 DIAGNOSIS — E785 Hyperlipidemia, unspecified: Secondary | ICD-10-CM | POA: Diagnosis present

## 2018-07-10 DIAGNOSIS — I249 Acute ischemic heart disease, unspecified: Principal | ICD-10-CM | POA: Diagnosis present

## 2018-07-10 DIAGNOSIS — I2510 Atherosclerotic heart disease of native coronary artery without angina pectoris: Secondary | ICD-10-CM | POA: Diagnosis present

## 2018-07-10 DIAGNOSIS — Z1159 Encounter for screening for other viral diseases: Secondary | ICD-10-CM | POA: Diagnosis not present

## 2018-07-10 DIAGNOSIS — Z882 Allergy status to sulfonamides status: Secondary | ICD-10-CM

## 2018-07-10 DIAGNOSIS — E7800 Pure hypercholesterolemia, unspecified: Secondary | ICD-10-CM | POA: Diagnosis present

## 2018-07-10 DIAGNOSIS — Z880 Allergy status to penicillin: Secondary | ICD-10-CM | POA: Diagnosis not present

## 2018-07-10 DIAGNOSIS — Z801 Family history of malignant neoplasm of trachea, bronchus and lung: Secondary | ICD-10-CM

## 2018-07-10 DIAGNOSIS — I10 Essential (primary) hypertension: Secondary | ICD-10-CM | POA: Diagnosis present

## 2018-07-10 DIAGNOSIS — R079 Chest pain, unspecified: Secondary | ICD-10-CM | POA: Diagnosis not present

## 2018-07-10 DIAGNOSIS — Z9049 Acquired absence of other specified parts of digestive tract: Secondary | ICD-10-CM

## 2018-07-10 DIAGNOSIS — G600 Hereditary motor and sensory neuropathy: Secondary | ICD-10-CM | POA: Diagnosis present

## 2018-07-10 DIAGNOSIS — E7849 Other hyperlipidemia: Secondary | ICD-10-CM | POA: Diagnosis not present

## 2018-07-10 DIAGNOSIS — Z9842 Cataract extraction status, left eye: Secondary | ICD-10-CM | POA: Diagnosis not present

## 2018-07-10 LAB — SARS CORONAVIRUS 2 AG (30 MIN TAT): SARS Coronavirus 2 Ag: NEGATIVE

## 2018-07-10 LAB — CBC WITH DIFFERENTIAL/PLATELET
Abs Immature Granulocytes: 0.02 10*3/uL (ref 0.00–0.07)
Basophils Absolute: 0 10*3/uL (ref 0.0–0.1)
Basophils Relative: 0 %
Eosinophils Absolute: 0.2 10*3/uL (ref 0.0–0.5)
Eosinophils Relative: 3 %
HCT: 41.7 % (ref 36.0–46.0)
Hemoglobin: 13.5 g/dL (ref 12.0–15.0)
Immature Granulocytes: 0 %
Lymphocytes Relative: 16 %
Lymphs Abs: 1.1 10*3/uL (ref 0.7–4.0)
MCH: 31.1 pg (ref 26.0–34.0)
MCHC: 32.4 g/dL (ref 30.0–36.0)
MCV: 96.1 fL (ref 80.0–100.0)
Monocytes Absolute: 0.4 10*3/uL (ref 0.1–1.0)
Monocytes Relative: 6 %
Neutro Abs: 4.8 10*3/uL (ref 1.7–7.7)
Neutrophils Relative %: 75 %
Platelets: 212 10*3/uL (ref 150–400)
RBC: 4.34 MIL/uL (ref 3.87–5.11)
RDW: 13 % (ref 11.5–15.5)
WBC: 6.5 10*3/uL (ref 4.0–10.5)
nRBC: 0 % (ref 0.0–0.2)

## 2018-07-10 LAB — COMPREHENSIVE METABOLIC PANEL
ALT: 20 U/L (ref 0–44)
AST: 22 U/L (ref 15–41)
Albumin: 4.5 g/dL (ref 3.5–5.0)
Alkaline Phosphatase: 54 U/L (ref 38–126)
Anion gap: 8 (ref 5–15)
BUN: 18 mg/dL (ref 8–23)
CO2: 27 mmol/L (ref 22–32)
Calcium: 9.5 mg/dL (ref 8.9–10.3)
Chloride: 103 mmol/L (ref 98–111)
Creatinine, Ser: <0.3 mg/dL — ABNORMAL LOW (ref 0.44–1.00)
Glucose, Bld: 105 mg/dL — ABNORMAL HIGH (ref 70–99)
Potassium: 3.6 mmol/L (ref 3.5–5.1)
Sodium: 138 mmol/L (ref 135–145)
Total Bilirubin: 0.6 mg/dL (ref 0.3–1.2)
Total Protein: 6.8 g/dL (ref 6.5–8.1)

## 2018-07-10 LAB — D-DIMER, QUANTITATIVE: D-Dimer, Quant: 0.33 ug{FEU}/mL (ref 0.00–0.50)

## 2018-07-10 LAB — TROPONIN I
Troponin I: <0.03 ng/mL (ref <0.03)
Troponin I: <0.03 ng/mL
Troponin I: <0.03 ng/mL

## 2018-07-10 LAB — LIPASE, BLOOD: Lipase: 30 U/L (ref 11–51)

## 2018-07-10 MED ORDER — NITROGLYCERIN 0.4 MG SL SUBL: 0.4000 mg | SUBLINGUAL_TABLET | SUBLINGUAL | Status: DC | PRN

## 2018-07-10 MED ORDER — ONDANSETRON HCL 4 MG/2ML IJ SOLN: 4.0000 mg | Freq: Four times a day (QID) | INTRAMUSCULAR | Status: DC | PRN

## 2018-07-10 MED ORDER — POTASSIUM CHLORIDE CRYS ER 10 MEQ PO TBCR
10.0000 meq | EXTENDED_RELEASE_TABLET | Freq: Every day | ORAL | Status: DC
  Administered 2018-07-11 – 2018-07-12 (×2): 10 meq via ORAL
  Filled 2018-07-10 (×3): qty 1

## 2018-07-10 MED ORDER — ASPIRIN 81 MG PO CHEW
162.0000 mg | CHEWABLE_TABLET | Freq: Once | ORAL | Status: AC
  Administered 2018-07-10: 162 mg via ORAL
  Filled 2018-07-10: qty 2

## 2018-07-10 MED ORDER — MORPHINE SULFATE (PF) 2 MG/ML IV SOLN: 2.0000 mg | INTRAVENOUS | Status: DC | PRN

## 2018-07-10 MED ORDER — FUROSEMIDE 20 MG PO TABS
20.0000 mg | ORAL_TABLET | Freq: Every day | ORAL | Status: DC
  Filled 2018-07-10: qty 1

## 2018-07-10 MED ORDER — ACETAMINOPHEN 325 MG PO TABS: 650.0000 mg | ORAL_TABLET | ORAL | Status: DC | PRN

## 2018-07-10 MED ORDER — MIRABEGRON ER 25 MG PO TB24
25.0000 mg | ORAL_TABLET | Freq: Every day | ORAL | Status: DC
  Administered 2018-07-10 – 2018-07-11 (×2): 25 mg via ORAL
  Filled 2018-07-10 (×3): qty 1

## 2018-07-10 MED ORDER — ENOXAPARIN SODIUM 40 MG/0.4ML ~~LOC~~ SOLN
40.0000 mg | SUBCUTANEOUS | Status: DC
  Administered 2018-07-10 – 2018-07-11 (×2): 40 mg via SUBCUTANEOUS
  Filled 2018-07-10 (×2): qty 0.4

## 2018-07-10 MED ORDER — LISINOPRIL 10 MG PO TABS
10.0000 mg | ORAL_TABLET | Freq: Every day | ORAL | Status: DC
  Filled 2018-07-10: qty 1

## 2018-07-10 MED ORDER — SUCRALFATE 1 GM/10ML PO SUSP: 1.0000 g | Freq: Three times a day (TID) | ORAL | Status: DC

## 2018-07-10 MED ORDER — ALUM & MAG HYDROXIDE-SIMETH 200-200-20 MG/5ML PO SUSP: 30.0000 mL | Freq: Four times a day (QID) | ORAL | Status: DC | PRN

## 2018-07-10 NOTE — ED Notes (Signed)
ED TO INPATIENT HANDOFF REPORT  ED Nurse Name and Phone #: Ubaldo Glassing [place] Name/Age/Gender Nina Rush 78 y.o. female Room/Bed: MH01/MH01  Code Status   Code Status: Not on file  Home/SNF/Other Home Patient oriented to: self, place, time and situation Is this baseline? Yes   Triage Complete: Triage complete  Chief Complaint Chest Pain  Triage Note Pt here with epigastric/chest pain this morning for about an hour. States she took 81mg  of aspirin when it occurred and she states she had an empty stomach.    Allergies Allergies  Allergen Reactions  . Penicillins Rash  . Sulfa Antibiotics Rash    Level of Care/Admitting Diagnosis ED Disposition    ED Disposition Condition Comment   Admit  Hospital Area: [facility] [[id number]]  Level of Care: Telemetry Medical [104]  I expect the patient will be discharged within 24 hours: No (not a candidate for 5C-Observation unit)  Covid Evaluation: Screening Protocol (No Symptoms)  Diagnosis: Chest pain [259563]  Admitting Physician: Norval Morton [[id number]]  Attending Physician: Norval Morton [[id number]]  PT Class (Do Not Modify): Observation [104]  PT Acc Code (Do Not Modify): Observation [10022]       B Medical/Surgery History Past Medical History:  Diagnosis Date  . Charcot-Marie-Tooth disease   . Fuchs' adenoma    bilateral eyes  . Hypertension   . Lichen planus    scalp   Past Surgical History:  Procedure Laterality Date  . ABDOMINAL HYSTERECTOMY     total  . APPENDECTOMY    . CATARACT EXTRACTION Bilateral   . CHOLECYSTECTOMY    . LEG SURGERY     as child     A IV Location/Drains/Wounds Patient Lines/Drains/Airways Status   Active Line/Drains/Airways    Name:   Placement date:   Placement time:   Site:   Days:   Peripheral IV 03/23/17 Left Forearm   03/23/17    2310    Forearm   474          Intake/Output Last 24 hours No intake or output data in the 24 hours ending 07/10/18  1235  Labs/Imaging Results for orders placed or performed during the hospital encounter of 07/10/18 (from the past 48 hour(s))  Comprehensive metabolic panel     Status: Abnormal   Collection Time: 07/10/18 10:33 AM  Result Value Ref Range   Sodium 138 135 - 145 mmol/L   Potassium 3.6 3.5 - 5.1 mmol/L   Chloride 103 98 - 111 mmol/L   CO2 27 22 - 32 mmol/L   Glucose, Bld 105 (H) 70 - 99 mg/dL   BUN 18 8 - 23 mg/dL   Creatinine, Ser <0.30 (L) 0.44 - 1.00 mg/dL   Calcium 9.5 8.9 - 10.3 mg/dL   Total Protein 6.8 6.5 - 8.1 g/dL   Albumin 4.5 3.5 - 5.0 g/dL   AST 22 15 - 41 U/L   ALT 20 0 - 44 U/L   Alkaline Phosphatase 54 38 - 126 U/L   Total Bilirubin 0.6 0.3 - 1.2 mg/dL   GFR calc non Af Amer NOT CALCULATED >60 mL/min   GFR calc Af Amer NOT CALCULATED >60 mL/min   Anion gap 8 5 - 15    Comment: Performed at [facility], [address] [place], [place] [address]  Lipase, blood     Status: None   Collection Time: 07/10/18 10:33 AM  Result Value Ref Range   Lipase 30 11 -  51 U/L    Comment: Performed at [facility], [address] [place], [place] [address]  CBC with Differential     Status: None   Collection Time: 07/10/18 10:33 AM  Result Value Ref Range   WBC 6.5 4.0 - 10.5 K/uL   RBC 4.34 3.87 - 5.11 MIL/uL   Hemoglobin 13.5 12.0 - 15.0 g/dL   HCT 41.7 36.0 - 46.0 %   MCV 96.1 80.0 - 100.0 fL   MCH 31.1 26.0 - 34.0 pg   MCHC 32.4 30.0 - 36.0 g/dL   RDW 13.0 11.5 - 15.5 %   Platelets 212 150 - 400 K/uL   nRBC 0.0 0.0 - 0.2 %   Neutrophils Relative % 75 %   Neutro Abs 4.8 1.7 - 7.7 K/uL   Lymphocytes Relative 16 %   Lymphs Abs 1.1 0.7 - 4.0 K/uL   Monocytes Relative 6 %   Monocytes Absolute 0.4 0.1 - 1.0 K/uL   Eosinophils Relative 3 %   Eosinophils Absolute 0.2 0.0 - 0.5 K/uL   Basophils Relative 0 %   Basophils Absolute 0.0 0.0 - 0.1 K/uL   Immature Granulocytes 0 %   Abs Immature Granulocytes 0.02 0.00 - 0.07 K/uL    Comment: Performed  at [facility], [address] [place], [place] [address]  Troponin I - Once     Status: None   Collection Time: 07/10/18 10:33 AM  Result Value Ref Range   Troponin I <0.03 <0.03 ng/mL    Comment: Performed at [facility], [address] [place], [place] [address]  SARS Coronavirus 2 (Hosp order,Performed in Center lab via [employer])     Status: None   Collection Time: 07/10/18 11:20 AM  Result Value Ref Range   SARS Coronavirus 2 ([employer] Now) NEGATIVE NEGATIVE    Comment: (NOTE) Interpretive Result Comment(s): COVID 19 Positive SARS CoV 2 target nucleic acids are DETECTED. The SARS CoV 2 RNA is generally detectable in upper and lower respiratory specimens during the acute phase of infection.  Positive results are indicative of active infection with SARS CoV 2.  Clinical correlation with patient history and other diagnostic information is necessary to determine patient infection status.  Positive results do not rule out bacterial infection or coinfection with other viruses. The expected result is Negative. COVID 19 Negative SARS CoV 2 target nucleic acids are NOT DETECTED. The SARS CoV 2 RNA is generally detectable in upper and lower respiratory specimens during the acute phase of infection.  Negative results do not preclude SARS CoV 2 infection, do not rule out coinfections with other pathogens, and should not be used as the sole basis for treatment or other patient management decisions.  Negative results must be combined with clinical  observations, patient history, and epidemiological information. The expected result is Negative. Invalid Presence or absence of SARS CoV 2 nucleic acids cannot be determined. Repeat testing was performed on the submitted specimen and repeated Invalid results were obtained.  If clinically indicated, additional testing on a new specimen with an alternate test methodology [phone]) is advised.  The SARS CoV 2 RNA  is generally detectable in upper and lower respiratory specimens during the acute phase of infection. The expected result is Negative. Fact Sheet for Patients:  GolfingFamily.no Fact Sheet for Healthcare Providers: https://www.hernandez-brewer.com/ This test is not yet approved or cleared by the [place] FDA and has been authorized for detection and/or diagnosis  of SARS CoV 2 by FDA under an Emergency Use Authorization (EUA).  This EUA will remain in effect (meaning this test can be used) for the duration of the COVID19 d eclaration under Section 564(b)(1) of the Act, 21 U.S.C. section [phone] 3(b)(1), unless the authorization is terminated or revoked sooner. Performed at [facility], [address], [place], [place] [address]    Dg Chest 2 View  Result Date: 07/10/2018 CLINICAL DATA:  Chest pain EXAM: CHEST - 2 VIEW COMPARISON:  03/24/2017 FINDINGS: Normal heart size. Lungs clear. No pneumothorax. No pleural effusion. IMPRESSION: No active cardiopulmonary disease. Electronically Signed   By: Marybelle Killings M.D.   On: 07/10/2018 11:02    Pending Labs Unresulted Labs (From admission, onward)   None      Vitals/Pain Today's Vitals   07/10/18 1018 07/10/18 1019  BP:  138/79  Pulse:  80  Resp:  18  Temp:  98.4 F (36.9 C)  TempSrc:  Oral  SpO2:  97%  PainSc: 0-No pain     Isolation Precautions No active isolations  Medications Medications  aspirin chewable tablet 162 mg (162 mg Oral Given 07/10/18 1215)    Mobility walks with device Moderate fall risk   Focused Assessments Cardiac Assessment Handoff:    Lab Results  Component Value Date   CKTOTAL 117 05/17/2011   CKMB 4.7 (H) 05/17/2011   TROPONINI <0.03 07/10/2018   No results found for: DDIMER Does the Patient currently have chest pain? No     R Recommendations: See Admitting Provider Note  Report given to:   Additional Notes: None

## 2018-07-10 NOTE — H&P (Signed)
History and Physical    Sun Kihn RCV:[id number] DOB: [birth year] DOA: 07/10/2018  Referring MD/NP/PA:Jordan Candice Camp   PCP: Derrill Center., MD  Patient coming from: Transfer from [facility]  Chief Complaint: Chest pain  I have personally briefly reviewed patient's old medical records in [place]   HPI: Nina Rush is a 78 y.o. female with medical history significant of HTN, Marie Charcot tooth disease, and lichen planus; who presented with complaints of chest pain starting this morning at 8:30 AM while getting ready for church.  She describes it as a substernal pressure feeling with some radiation to the back.  Denies having any nausea, vomiting, abdominal pain, lightheadedness, or diaphoresis symptoms.  She took an 81 mg aspirin without relief of symptoms.  Pain ultimately resolved on its own after about an hour and 15 minutes.  Her husband has nitroglycerin, but she did not try this.  Notes having similar pain symptoms at the first part of last week that were short lasting and resolved after aspirin.  Other associated symptoms include over the last month intermittant tingling in her left hand, mild lower extremity edema, and waking up hot.  Did note the tingling was related to her [place] Longest tooth disease.   Normally follows with cardiology at [facility].  Reported having a negative stress test several years ago.  At baseline patient ambulates with use of a rolling walker.   ED Course:On admission vital signs were noted to be within normal limits. Initial troponin negative.  EKG reported to show new T wave inversions in aVL.  Lab work was otherwise unremarkable.  COVID-19 testing negative.  CXR without any acute abnormalities noted.  Accepted as observation to a telemetry bed.  Patient was given 162 mg of aspirin.  Review of Systems  Constitutional: Negative for chills and fever.  HENT: Negative for ear discharge and nosebleeds.   Eyes: Negative for double vision  and photophobia.  Respiratory: Negative for cough and shortness of breath.   Cardiovascular: Positive for chest pain and leg swelling.  Gastrointestinal: Negative for abdominal pain, nausea and vomiting.  Genitourinary: Negative for dysuria and frequency.  Musculoskeletal: Negative for falls.  Neurological: Positive for sensory change.  Psychiatric/Behavioral: Negative for memory loss and substance abuse.    Past Medical History:  Diagnosis Date  . Charcot-Marie-Tooth disease   . Fuchs' adenoma    bilateral eyes  . Hypertension   . Lichen planus    scalp    Past Surgical History:  Procedure Laterality Date  . ABDOMINAL HYSTERECTOMY     total  . APPENDECTOMY    . CATARACT EXTRACTION Bilateral   . CHOLECYSTECTOMY    . LEG SURGERY     as child     reports that she has never smoked. She has never used smokeless tobacco. She reports that she does not drink alcohol or use drugs.  Allergies  Allergen Reactions  . Penicillins Rash  . Sulfa Antibiotics Rash    Family History  Problem Relation Age of Onset  . Cancer - Lung Mother   . Stroke Father     Prior to Admission medications   Medication Sig Start Date End Date Taking? Authorizing Provider  ciprofloxacin (CIPRO) 500 MG tablet 500 mg daily. X 1 month 09/06/15   [provider]  clobetasol (TEMOVATE) 0.05 % external solution  04/27/13   [provider]  estradiol (ESTRACE) 0.1 MG/GM vaginal cream Apply pea size amount every other day as directed  [provider]  furosemide (LASIX) 20 MG tablet 20 mg daily. 10/18/15   [provider]  KLOR-CON M10 10 MEQ tablet 10 mEq. 10/18/15   [provider]  lisinopril (PRINIVIL,ZESTRIL) 10 MG tablet Take 10 mg by mouth daily.    [provider]  mirabegron ER (MYRBETRIQ) 25 MG TB24 tablet Take 25 mg by mouth daily.    [provider]  solifenacin (VESICARE) 10 MG tablet Take by mouth daily.    [provider]   sucralfate (CARAFATE) 1 GM/10ML suspension Take 10 mLs (1 g total) by mouth 4 (four) times daily -  with meals and at bedtime. 03/24/17   Palumbo, April, MD  vitamin C (ASCORBIC ACID) 500 MG tablet 500 mg.    [provider]    Physical Exam:  Constitutional: Elderly female in NAD, calm, comfortable Vitals:   07/10/18 1019  BP: 138/79  Pulse: 80  Resp: 18  Temp: 98.4 F (36.9 C)  TempSrc: Oral  SpO2: 97%   Eyes: PERRL, lids and conjunctivae normal ENMT: Mucous membranes are moist. Posterior pharynx clear of any exudate or lesions.  Neck: normal, supple, no masses, no thyromegaly Respiratory: clear to auscultation bilaterally, no wheezing, no crackles. Normal respiratory effort. No accessory muscle use.  Cardiovascular: Regular rate and rhythm, no murmurs / rubs / gallops.  Compression stockings present trace lower extremity edema. 2+ pedal pulses. No carotid bruits.  Abdomen: no tenderness, no masses palpated. No hepatosplenomegaly. Bowel sounds positive.  Musculoskeletal: no clubbing / cyanosis. No joint deformity upper and lower extremities. Good ROM, no contractures. Normal muscle tone.  Skin: no rashes, lesions, ulcers. No induration Neurologic: CN 2-12 grossly intact. Sensation intact, DTR normal. Strength 5/5 in all 4.  Psychiatric: Normal judgment and insight. Alert and oriented x 3. Normal mood.     Labs on Admission: I have personally reviewed following labs and imaging studies  CBC: Recent Labs  Lab 07/10/18 1033  WBC 6.5  NEUTROABS 4.8  HGB 13.5  HCT 41.7  MCV 96.1  PLT 098   Basic Metabolic Panel: Recent Labs  Lab 07/10/18 1033  NA 138  K 3.6  CL 103  CO2 27  GLUCOSE 105*  BUN 18  CREATININE <0.30*  CALCIUM 9.5   GFR: CrCl cannot be calculated (This lab value cannot be used to calculate CrCl because it is not a number: <0.30). Liver Function Tests: Recent Labs  Lab 07/10/18 1033  AST 22  ALT 20  ALKPHOS 54  BILITOT 0.6  PROT 6.8   ALBUMIN 4.5   Recent Labs  Lab 07/10/18 1033  LIPASE 30   No results for input(s): AMMONIA in the last 168 hours. Coagulation Profile: No results for input(s): INR, PROTIME in the last 168 hours. Cardiac Enzymes: Recent Labs  Lab 07/10/18 1033  TROPONINI <0.03   BNP (last 3 results) No results for input(s): PROBNP in the last 8760 hours. HbA1C: No results for input(s): HGBA1C in the last 72 hours. CBG: No results for input(s): GLUCAP in the last 168 hours. Lipid Profile: No results for input(s): CHOL, HDL, LDLCALC, TRIG, CHOLHDL, LDLDIRECT in the last 72 hours. Thyroid Function Tests: No results for input(s): TSH, T4TOTAL, FREET4, T3FREE, THYROIDAB in the last 72 hours. Anemia Panel: No results for input(s): VITAMINB12, FOLATE, FERRITIN, TIBC, IRON, RETICCTPCT in the last 72 hours. Urine analysis:    Component Value Date/Time   COLORURINE YELLOW 03/23/2017 0101   APPEARANCEUR CLEAR 03/23/2017 0101   LABSPEC 1.015 03/23/2017  0101   PHURINE 7.0 03/23/2017 0101   GLUCOSEU NEGATIVE 03/23/2017 0101   HGBUR SMALL (A) 03/23/2017 0101   BILIRUBINUR NEGATIVE 03/23/2017 0101   KETONESUR NEGATIVE 03/23/2017 0101   PROTEINUR NEGATIVE 03/23/2017 0101   UROBILINOGEN 0.2 05/17/2011 0057   NITRITE NEGATIVE 03/23/2017 0101   LEUKOCYTESUR SMALL (A) 03/23/2017 0101   Sepsis Labs: Recent Results (from the past 240 hour(s))  SARS Coronavirus 2 (Hosp order,Performed in [place] lab via Abbott ID)     Status: None   Collection Time: 07/10/18 11:20 AM  Result Value Ref Range Status   SARS Coronavirus 2 (Abbott ID Now) NEGATIVE NEGATIVE Final    Comment: (NOTE) Interpretive Result Comment(s): COVID 19 Positive SARS CoV 2 target nucleic acids are DETECTED. The SARS CoV 2 RNA is generally detectable in upper and lower respiratory specimens during the acute phase of infection.  Positive results are indicative of active infection with SARS CoV 2.  Clinical correlation with patient  history and other diagnostic information is necessary to determine patient infection status.  Positive results do not rule out bacterial infection or coinfection with other viruses. The expected result is Negative. COVID 19 Negative SARS CoV 2 target nucleic acids are NOT DETECTED. The SARS CoV 2 RNA is generally detectable in upper and lower respiratory specimens during the acute phase of infection.  Negative results do not preclude SARS CoV 2 infection, do not rule out coinfections with other pathogens, and should not be used as the sole basis for treatment or other patient management decisions.  Negative results must be combined with clinical  observations, patient history, and epidemiological information. The expected result is Negative. Invalid Presence or absence of SARS CoV 2 nucleic acids cannot be determined. Repeat testing was performed on the submitted specimen and repeated Invalid results were obtained.  If clinically indicated, additional testing on a new specimen with an alternate test methodology [phone]) is advised.  The SARS CoV 2 RNA is generally detectable in upper and lower respiratory specimens during the acute phase of infection. The expected result is Negative. Fact Sheet for Patients:  GolfingFamily.no Fact Sheet for Healthcare Providers: https://www.hernandez-brewer.com/ This test is not yet approved or cleared by the [place] FDA and has been authorized for detection and/or diagnosis of SARS CoV 2 by FDA under an Emergency Use Authorization (EUA).  This EUA will remain in effect (meaning this test can be used) for the duration of the COVID19 d eclaration under Section 564(b)(1) of the Act, 21 U.S.C. section [phone] 3(b)(1), unless the authorization is terminated or revoked sooner. Performed at [facility], [address] [place], [place] [address]      Radiological Exams on Admission: Dg Chest 2  View  Result Date: 07/10/2018 CLINICAL DATA:  Chest pain EXAM: CHEST - 2 VIEW COMPARISON:  03/24/2017 FINDINGS: Normal heart size. Lungs clear. No pneumothorax. No pleural effusion. IMPRESSION: No active cardiopulmonary disease. Electronically Signed   By: Marybelle Killings M.D.   On: 07/10/2018 11:02    EKG: Independently reviewed.  Sinus rhythm at 80 bpm with new T wave inversion in aVL  Assessment/Plan Chest pain: Resolved.  Patient presents with complaints of substernal chest pressure with radiation to her back. Chest pain resolved on its own after 1 hour and 15 minutes.  EKG noting new T wave inversion in aVL and initial troponin negative. Chest x-ray otherwise clear.  Reports having stress test several years ago was normal.  Previously seen by Dr. Wyline Copas at  [facility].  -Admit to a telemetry bed admit -Trend cardiac troponin -Check d-dimer -Check echocardiogram -Check lipid panel in a.m. -Nitroglycerin as needed  -Appreciate Dr. Doylene Canard, plan for nuclear stress test in a.m.  Essential hypertension: Blood pressures appear well controlled. -Continue lisinopril and furosemide  Hyperlipidemia: Patient not on any medication for treatment. -Follow-up lipid panel   DVT prophylaxis: lovenox Code Status: Full  Family Communication: Family present at bedside Disposition Plan: Possible discharge home if work-up negative Consults called: Cardiology Dr. Doylene Canard Admission status: Observation  Norval Morton MD [facility] Pager [phone]   If 7PM-7AM, please contact night-coverage [email or web address] Password [facility]  07/10/2018, 2:24 PM

## 2018-07-10 NOTE — Consult Note (Signed)
Referring Physician:  [name] is an 78 y.o. female.                       Chief Complaint: Chest pain  HPI: 78 years old white female with PMH of hypertension and hyperlipidemia has pressure type and sharp substernal non-radiating chest pain while she was putting eye drops this AM. She had some shortness of breath. She denies nausea, diaphoresis, fever or cough. She wears compression stocking for her chronic bilateral lower leg edema. CXR- unremarkable. Troponin I negative so far. CBC, CMET and Lipase near normal.  Past Medical History:  Diagnosis Date  . Charcot-Marie-Tooth disease   . Fuchs' adenoma    bilateral eyes  . Hypertension   . Lichen planus    scalp      Past Surgical History:  Procedure Laterality Date  . ABDOMINAL HYSTERECTOMY     total  . APPENDECTOMY    . CATARACT EXTRACTION Bilateral   . CHOLECYSTECTOMY    . LEG SURGERY     as child    Family History  Problem Relation Age of Onset  . Cancer - Lung Mother   . Stroke Father    Social History:  reports that she has never smoked. She has never used smokeless tobacco. She reports that she does not drink alcohol or use drugs.  Allergies:  Allergies  Allergen Reactions  . Penicillins Rash  . Sulfa Antibiotics Rash    Medications Prior to Admission  Medication Sig Dispense Refill  . ciprofloxacin (CIPRO) 500 MG tablet 500 mg daily. X 1 month    . clobetasol (TEMOVATE) 0.05 % external solution     . estradiol (ESTRACE) 0.1 MG/GM vaginal cream Apply pea size amount every other day as directed    . furosemide (LASIX) 20 MG tablet 20 mg daily.    Marland Kitchen KLOR-CON M10 10 MEQ tablet 10 mEq.    Marland Kitchen lisinopril (PRINIVIL,ZESTRIL) 10 MG tablet Take 10 mg by mouth daily.    . mirabegron ER (MYRBETRIQ) 25 MG TB24 tablet Take 25 mg by mouth daily.    . solifenacin (VESICARE) 10 MG tablet Take by mouth daily.    . sucralfate (CARAFATE) 1 GM/10ML suspension Take 10 mLs (1 g total) by mouth 4 (four) times daily -  with meals  and at bedtime. 420 mL 0  . vitamin C (ASCORBIC ACID) 500 MG tablet 500 mg.      Results for orders placed or performed during the hospital encounter of 07/10/18 (from the past 48 hour(s))  Comprehensive metabolic panel     Status: Abnormal   Collection Time: 07/10/18 10:33 AM  Result Value Ref Range   Sodium 138 135 - 145 mmol/L   Potassium 3.6 3.5 - 5.1 mmol/L   Chloride 103 98 - 111 mmol/L   CO2 27 22 - 32 mmol/L   Glucose, Bld 105 (H) 70 - 99 mg/dL   BUN 18 8 - 23 mg/dL   Creatinine, Ser <0.30 (L) 0.44 - 1.00 mg/dL   Calcium 9.5 8.9 - 10.3 mg/dL   Total Protein 6.8 6.5 - 8.1 g/dL   Albumin 4.5 3.5 - 5.0 g/dL   AST 22 15 - 41 U/L   ALT 20 0 - 44 U/L   Alkaline Phosphatase 54 38 - 126 U/L   Total Bilirubin 0.6 0.3 - 1.2 mg/dL   GFR calc non Af Amer NOT CALCULATED >60 mL/min   GFR calc Af Amer NOT CALCULATED >60 mL/min  Anion gap 8 5 - 15    Comment: Performed at [facility], [address] [place], [place] [address]  Lipase, blood     Status: None   Collection Time: 07/10/18 10:33 AM  Result Value Ref Range   Lipase 30 11 - 51 U/L    Comment: Performed at [facility], [address] [place], [place] [address]  CBC with Differential     Status: None   Collection Time: 07/10/18 10:33 AM  Result Value Ref Range   WBC 6.5 4.0 - 10.5 K/uL   RBC 4.34 3.87 - 5.11 MIL/uL   Hemoglobin 13.5 12.0 - 15.0 g/dL   HCT 41.7 36.0 - 46.0 %   MCV 96.1 80.0 - 100.0 fL   MCH 31.1 26.0 - 34.0 pg   MCHC 32.4 30.0 - 36.0 g/dL   RDW 13.0 11.5 - 15.5 %   Platelets 212 150 - 400 K/uL   nRBC 0.0 0.0 - 0.2 %   Neutrophils Relative % 75 %   Neutro Abs 4.8 1.7 - 7.7 K/uL   Lymphocytes Relative 16 %   Lymphs Abs 1.1 0.7 - 4.0 K/uL   Monocytes Relative 6 %   Monocytes Absolute 0.4 0.1 - 1.0 K/uL   Eosinophils Relative 3 %   Eosinophils Absolute 0.2 0.0 - 0.5 K/uL   Basophils Relative 0 %   Basophils Absolute 0.0 0.0 - 0.1 K/uL   Immature Granulocytes 0 %   Abs  Immature Granulocytes 0.02 0.00 - 0.07 K/uL    Comment: Performed at [facility], [address] [place], [place] [address]  Troponin I - Once     Status: None   Collection Time: 07/10/18 10:33 AM  Result Value Ref Range   Troponin I <0.03 <0.03 ng/mL    Comment: Performed at [facility], [address] [place], [place] [address]  SARS Coronavirus 2 (Hosp order,Performed in [place] lab via [employer] ID)     Status: None   Collection Time: 07/10/18 11:20 AM  Result Value Ref Range   SARS Coronavirus 2 (Abbott ID Now) NEGATIVE NEGATIVE    Comment: (NOTE) Interpretive Result Comment(s): COVID 19 Positive SARS CoV 2 target nucleic acids are DETECTED. The SARS CoV 2 RNA is generally detectable in upper and lower respiratory specimens during the acute phase of infection.  Positive results are indicative of active infection with SARS CoV 2.  Clinical correlation with patient history and other diagnostic information is necessary to determine patient infection status.  Positive results do not rule out bacterial infection or coinfection with other viruses. The expected result is Negative. COVID 19 Negative SARS CoV 2 target nucleic acids are NOT DETECTED. The SARS CoV 2 RNA is generally detectable in upper and lower respiratory specimens during the acute phase of infection.  Negative results do not preclude SARS CoV 2 infection, do not rule out coinfections with other pathogens, and should not be used as the sole basis for treatment or other patient management decisions.  Negative results must be combined with clinical  observations, patient history, and epidemiological information. The expected result is Negative. Invalid Presence or absence of SARS CoV 2 nucleic acids cannot be determined. Repeat testing was performed on the submitted specimen and repeated Invalid results were obtained.  If clinically indicated, additional testing on a new specimen with an  alternate test methodology [phone]) is advised.  The SARS CoV 2 RNA is generally  detectable in upper and lower respiratory specimens during the acute phase of infection. The expected result is Negative. Fact Sheet for Patients:  GolfingFamily.no Fact Sheet for Healthcare Providers: https://www.hernandez-brewer.com/ This test is not yet approved or cleared by the [place] FDA and has been authorized for detection and/or diagnosis of SARS CoV 2 by FDA under an Emergency Use Authorization (EUA).  This EUA will remain in effect (meaning this test can be used) for the duration of the COVID19 d eclaration under Section 564(b)(1) of the Act, 21 U.S.C. section [phone] 3(b)(1), unless the authorization is terminated or revoked sooner. Performed at [facility], [address] [place], [place] [address]    Dg Chest 2 View  Result Date: 07/10/2018 CLINICAL DATA:  Chest pain EXAM: CHEST - 2 VIEW COMPARISON:  03/24/2017 FINDINGS: Normal heart size. Lungs clear. No pneumothorax. No pleural effusion. IMPRESSION: No active cardiopulmonary disease. Electronically Signed   By: Marybelle Killings M.D.   On: 07/10/2018 11:02    Review Of Systems Constitutional: No fever, chills, weight loss or gain. Eyes: No vision change, wears glasses. No discharge or pain. Ears: No hearing loss, No tinnitus. Respiratory: No asthma, COPD, pneumonias. Positive shortness of breath. No hemoptysis. Cardiovascular: Positive chest pain, palpitation, leg edema. Gastrointestinal: No nausea, vomiting, diarrhea, constipation. No GI bleed. No hepatitis. Genitourinary: No dysuria, hematuria, kidney stone. No incontinance. Neurological: No headache, stroke, seizures.  Psychiatry: No psych facility admission for anxiety, depression, suicide. No detox. Skin: No rash. Musculoskeletal: Positive joint pain, no fibromyalgia. No neck pain, back pain. Lymphadenopathy: No  lymphadenopathy. Hematology: No anemia or easy bruising.   Blood pressure 131/61, pulse 76, temperature 97.7 F (36.5 C), temperature source Oral, resp. rate 20, SpO2 99 %. There is no height or weight on file to calculate BMI. General appearance: alert, cooperative, appears stated age and no distress Head: Normocephalic, atraumatic. Eyes: Blue eyes, pink conjunctiva, corneas clear. PERRL, EOM's intact. Neck: No adenopathy, no carotid bruit, no JVD, supple, symmetrical, trachea midline and thyroid not enlarged. Resp: Clear to auscultation bilaterally. Cardio: Regular rate and rhythm, S1, S2 normal, II/VI systolic murmur, no click, rub or gallop GI: Soft, non-tender; bowel sounds normal; no organomegaly. Extremities: Trace edema with support stocking, no cyanosis or clubbing. Skin: Warm and dry.  Neurologic: Alert and oriented X 3, normal strength. Normal coordination.  Assessment/Plan Acute coronary syndrome Hypertension Hyperlipidemia  Agree with r/o MI Nuclear stress test in AM unless has elevated troponin I.  Birdie Riddle, MD  07/10/2018, 4:23 PM

## 2018-07-10 NOTE — Progress Notes (Addendum)
Pt received from [facility] via [facility], denies cp or sob, paged Dr Tamala Julian to advise of pt arrival to unit, oriented to bed calllight, and that awaiting orders, pt  Given menu and pt guide , telemetry applied to pt, box 25, ccmd called and sr on telemetry, pt instructed on valuable policy and denies need to lock up, pt has purse but reports no valuables or meds

## 2018-07-10 NOTE — ED Provider Notes (Signed)
[facility] Provider Note   CSN: [id number] Arrival date & time: 07/10/18  1002    History   Chief Complaint Chief Complaint  Patient presents with  . Chest Pain  . Abdominal Pain    HPI Selma Mink is a 78 y.o. female with past medical history of hypertension, Charcot-Marie-Tooth disease, presenting to the emergency department with sudden onset of substernal chest pain that began around 8:30 AM this morning.  She states she was putting eyedrops in her eyes when she felt the sudden pain.  She states the pain is sharp but also pressure sensation.  The pain was constant for about an hour to 1 hour and 15 minutes and then subsided without intervention.  She states she did take aspirin, however this did not provide relief.  She states she may felt a little bit of shortness of breath with the chest pain.  She denies associated diaphoresis, nausea, fever, cough, congestion.  She is followed by [facility] cardiology.  Per chart review, last year she had an echocardiogram done however I am unable to see the results.  She states she was informed there was no abnormalities.  She states she also had a stress test many years ago which was normal.  She has had chronic bilateral lower extremity swelling, and wears compression stockings.  She also takes lasix for this.  No more swelling than usual today.  Family history includes Mother diagnosed with heart disease in her 70s.      A 78 year old patient with a history of hypertension and hypercholesterolemia presents for evaluation of chest pain. Initial onset of pain was approximately 1-3 hours ago. The patient's chest pain is described as heaviness/pressure/tightness, is sharp and is not worse with exertion. The patient's chest pain is middle- or left-sided, is not well-localized and does not radiate to the arms/jaw/neck. The patient does not complain of nausea and denies diaphoresis. The patient has a family history of coronary  artery disease in a first-degree relative with onset less than age 74. The patient has no history of stroke, has no history of peripheral artery disease, has not smoked in the past 90 days, denies any history of treated diabetes and does not have an elevated BMI (>=30).   The history is provided by the patient and medical records.    Past Medical History:  Diagnosis Date  . Charcot-Marie-Tooth disease   . Fuchs' adenoma    bilateral eyes  . Hypertension   . Lichen planus    scalp    There are no active problems to display for this patient.   Past Surgical History:  Procedure Laterality Date  . ABDOMINAL HYSTERECTOMY     total  . APPENDECTOMY    . CATARACT EXTRACTION Bilateral   . CHOLECYSTECTOMY    . LEG SURGERY     as child     OB History   No obstetric history on file.      Home Medications    Prior to Admission medications   Medication Sig Start Date End Date Taking? Authorizing Provider  ciprofloxacin (CIPRO) 500 MG tablet 500 mg daily. X 1 month 09/06/15   [provider]  clobetasol (TEMOVATE) 0.05 % external solution  04/27/13   [provider]  estradiol (ESTRACE) 0.1 MG/GM vaginal cream Apply pea size amount every other day as directed    [provider]  furosemide (LASIX) 20 MG tablet 20 mg daily. 10/18/15   [provider]  KLOR-CON M10  10 MEQ tablet 10 mEq. 10/18/15   [provider]  lisinopril (PRINIVIL,ZESTRIL) 10 MG tablet Take 10 mg by mouth daily.    [provider]  mirabegron ER (MYRBETRIQ) 25 MG TB24 tablet Take 25 mg by mouth daily.    [provider]  solifenacin (VESICARE) 10 MG tablet Take by mouth daily.    [provider]  sucralfate (CARAFATE) 1 GM/10ML suspension Take 10 mLs (1 g total) by mouth 4 (four) times daily -  with meals and at bedtime. 03/24/17   Palumbo, April, MD  vitamin C (ASCORBIC ACID) 500 MG tablet 500 mg.    [provider]    Family History  Family History  Problem Relation Age of Onset  . Cancer - Lung Mother   . Stroke Father     Social History Social History   Tobacco Use  . Smoking status: Never Smoker  . Smokeless tobacco: Never Used  Substance Use Topics  . Alcohol use: No  . Drug use: No     Allergies   Penicillins and Sulfa antibiotics   Review of Systems Review of Systems  Constitutional: Negative for diaphoresis.  Respiratory: Positive for shortness of breath. Negative for cough.   Cardiovascular: Positive for chest pain and leg swelling.  Gastrointestinal: Negative for nausea.  All other systems reviewed and are negative.    Physical Exam Updated Vital Signs BP 138/79 (BP Location: Right Arm)   Pulse 80   Temp 98.4 F (36.9 C) (Oral)   Resp 18   SpO2 97%   Physical Exam Vitals signs and nursing note reviewed.  Constitutional:      General: She is not in acute distress.    Appearance: She is well-developed. She is not ill-appearing.  HENT:     Head: Normocephalic and atraumatic.  Eyes:     Conjunctiva/sclera: Conjunctivae normal.  Neck:     Musculoskeletal: Normal range of motion and neck supple.  Cardiovascular:     Rate and Rhythm: Normal rate and regular rhythm.     Pulses: Normal pulses.  Pulmonary:     Effort: Pulmonary effort is normal. No respiratory distress.     Breath sounds: Normal breath sounds.  Chest:     Chest wall: No tenderness.  Abdominal:     General: Abdomen is flat. Bowel sounds are normal. There is no distension.     Palpations: Abdomen is soft.     Tenderness: There is abdominal tenderness in the epigastric area. There is no guarding or rebound. Negative signs include Murphy's sign.  Musculoskeletal:     Comments: BLE edema  Skin:    General: Skin is warm.  Neurological:     Mental Status: She is alert.  Psychiatric:        Behavior: Behavior normal.      ED Treatments / Results  Labs (all labs ordered are listed, but only abnormal results are  displayed) Labs Reviewed  COMPREHENSIVE METABOLIC PANEL - Abnormal; Notable for the following components:      Result Value   Glucose, Bld 105 (*)    Creatinine, Ser <0.30 (*)    All other components within normal limits  SARS CORONAVIRUS 2 (HOSP ORDER, PERFORMED IN [facility] VIA [employer] ID)  LIPASE, BLOOD  CBC WITH DIFFERENTIAL/PLATELET  TROPONIN I  TROPONIN I  TROPONIN I  TROPONIN I  D-DIMER, QUANTITATIVE (NOT AT [facility])    EKG EKG Interpretation  Date/Time:  Sunday Jul 10 2018 10:18:35 EDT Ventricular Rate:  80 PR Interval:    QRS Duration: 96 QT Interval:  399 QTC Calculation: 461 R Axis:   -3 Text Interpretation:  Sinus rhythm TW inversions aVL new from prior, baseline wander Confirmed by Gareth Morgan [phone]) on 07/10/2018 10:52:41 AM   Radiology Dg Chest 2 View  Result Date: 07/10/2018 CLINICAL DATA:  Chest pain EXAM: CHEST - 2 VIEW COMPARISON:  03/24/2017 FINDINGS: Normal heart size. Lungs clear. No pneumothorax. No pleural effusion. IMPRESSION: No active cardiopulmonary disease. Electronically Signed   By: Marybelle Killings M.D.   On: 07/10/2018 11:02    Procedures Procedures (including critical care time)  Medications Ordered in ED Medications  acetaminophen (TYLENOL) tablet 650 mg (has no administration in time range)  ondansetron (ZOFRAN) injection 4 mg (has no administration in time range)  enoxaparin (LOVENOX) injection 40 mg (has no administration in time range)  morphine 2 MG/ML injection 2 mg (has no administration in time range)  alum & mag hydroxide-simeth (MAALOX/MYLANTA) 200-200-20 MG/5ML suspension 30 mL (has no administration in time range)  nitroGLYCERIN (NITROSTAT) SL tablet 0.4 mg (has no administration in time range)  aspirin chewable tablet 162 mg (162 mg Oral Given 07/10/18 1215)     Initial Impression / Assessment and Plan / ED Course  I have reviewed the triage vital signs and the nursing notes.  Pertinent labs & imaging results that  were available during my care of the patient were reviewed by me and considered in my medical decision making (see chart for details).  Clinical Course as of Jul 09 1149  Sun Jul 10, 2018  1149 Dr. Tamala Julian with triad accepting admission to [facility] for chest pain rule out.   [JR]    Clinical Course User Index [JR] Jassiel Flye, [place] N, PA-C       Patient presenting with substernal chest pain/pressure this morning.  No known prior cardiac history, however positive family history and risk factors with a heart score of 5.  Chest pain-free upon arrival to the ED.  Initial troponin is negative.  Labs are unremarkable.  EKG with new T wave inversions in aVL.  Concern for cardiac etiology of Chest Pain.  Patient discussed with and evaluated by Dr. Billy Fischer.  Consult placed to specialist for admission.  Patient transferred to [facility] for cardiac work-up.  Final Clinical Impressions(s) / ED Diagnoses   Final diagnoses:  Substernal chest pain    ED Discharge Orders    None       Coyt Govoni, [place] N, PA-C 07/10/18 1538    Gareth Morgan, MD 07/10/18 1556

## 2018-07-10 NOTE — ED Triage Notes (Signed)
Pt here with epigastric/chest pain this morning for about an hour. States she took 81mg  of aspirin when it occurred and she states she had an empty stomach.

## 2018-07-10 NOTE — Plan of Care (Signed)
Discussed case with [place] Robinson, PA-C at [facility]. Nina Rush with pmh of HTN, Lichen planus; who presented with complaints of chest/epigastric pain from home.  Took aspirin at home 81 mg with relief of symptoms.  Initial troponin negative.  New T wave inversions noted on EKG. CXR without any acute abnormalities noted.  Normally follows with cardiology at [facility].  Accepted as observation to a telemetry bed.  Patient to be given full dose of aspirin if has not already been given.

## 2018-07-11 ENCOUNTER — Observation Stay (HOSPITAL_COMMUNITY): Payer: PPO

## 2018-07-11 ENCOUNTER — Encounter (HOSPITAL_COMMUNITY): Payer: Self-pay | Admitting: Internal Medicine

## 2018-07-11 ENCOUNTER — Other Ambulatory Visit: Payer: Self-pay

## 2018-07-11 ENCOUNTER — Encounter (HOSPITAL_COMMUNITY)
Admission: EM | Disposition: A | Discharge status: Home / Self Care | Payer: Self-pay | Source: Home / Self Care | Attending: Internal Medicine

## 2018-07-11 DIAGNOSIS — I249 Acute ischemic heart disease, unspecified: Secondary | ICD-10-CM | POA: Diagnosis present

## 2018-07-11 DIAGNOSIS — R079 Chest pain, unspecified: Secondary | ICD-10-CM | POA: Diagnosis not present

## 2018-07-11 HISTORY — PX: LEFT HEART CATH AND CORONARY ANGIOGRAPHY: CATH118249

## 2018-07-11 LAB — TROPONIN I: Troponin I: <0.03 ng/mL (ref <0.03)

## 2018-07-11 LAB — ECHOCARDIOGRAM COMPLETE
Height: 63 in
Weight: 1795.2 oz

## 2018-07-11 LAB — LIPID PANEL
Cholesterol: 205 mg/dL — ABNORMAL HIGH (ref 0–200)
HDL: 86 mg/dL (ref >40)
LDL Cholesterol: 106 mg/dL — ABNORMAL HIGH (ref 0–99)
Total CHOL/HDL Ratio: 2.4 RATIO
Triglycerides: 67 mg/dL (ref <150)
VLDL: 13 mg/dL (ref 0–40)

## 2018-07-11 SURGERY — LEFT HEART CATH AND CORONARY ANGIOGRAPHY
Anesthesia: LOCAL

## 2018-07-11 MED ORDER — SODIUM CHLORIDE 0.9% FLUSH: 3.0000 mL | INTRAVENOUS | Status: DC | PRN

## 2018-07-11 MED ORDER — REGADENOSON 0.4 MG/5ML IV SOLN
0.4000 mg | Freq: Once | INTRAVENOUS | Status: AC
  Administered 2018-07-11: 0.4 mg via INTRAVENOUS
  Filled 2018-07-11: qty 5

## 2018-07-11 MED ORDER — SODIUM CHLORIDE 0.9 % IV SOLN: 250.0000 mL | INTRAVENOUS | Status: DC | PRN

## 2018-07-11 MED ORDER — LIDOCAINE HCL (PF) 1 % IJ SOLN
INTRAMUSCULAR | Status: AC
  Filled 2018-07-11: qty 30

## 2018-07-11 MED ORDER — HEPARIN (PORCINE) IN NACL 1000-0.9 UT/500ML-% IV SOLN
INTRAVENOUS | Status: DC | PRN
  Administered 2018-07-11 (×2): 500 mL

## 2018-07-11 MED ORDER — FENTANYL CITRATE (PF) 100 MCG/2ML IJ SOLN
INTRAMUSCULAR | Status: DC | PRN
  Administered 2018-07-11: 25 ug via INTRAVENOUS

## 2018-07-11 MED ORDER — SODIUM CHLORIDE 0.9% FLUSH
3.0000 mL | Freq: Two times a day (BID) | INTRAVENOUS | Status: DC
  Administered 2018-07-11 – 2018-07-12 (×2): 3 mL via INTRAVENOUS

## 2018-07-11 MED ORDER — TECHNETIUM TC 99M TETROFOSMIN IV KIT
10.0000 | PACK | Freq: Once | INTRAVENOUS | Status: AC | PRN
  Administered 2018-07-11: 10 via INTRAVENOUS

## 2018-07-11 MED ORDER — ASPIRIN 81 MG PO CHEW
81.0000 mg | CHEWABLE_TABLET | ORAL | Status: AC
  Administered 2018-07-11: 81 mg via ORAL
  Filled 2018-07-11: qty 1

## 2018-07-11 MED ORDER — NITROGLYCERIN 0.4 MG SL SUBL
SUBLINGUAL_TABLET | SUBLINGUAL | Status: AC
  Administered 2018-07-11: 0.4 mg via SUBLINGUAL
  Filled 2018-07-11: qty 1

## 2018-07-11 MED ORDER — SODIUM CHLORIDE 0.9 % IV BOLUS
500.0000 mL | Freq: Once | INTRAVENOUS | Status: AC
  Administered 2018-07-11: 500 mL via INTRAVENOUS

## 2018-07-11 MED ORDER — FENTANYL CITRATE (PF) 100 MCG/2ML IJ SOLN
INTRAMUSCULAR | Status: AC
  Filled 2018-07-11: qty 2

## 2018-07-11 MED ORDER — LIDOCAINE HCL (PF) 1 % IJ SOLN
INTRAMUSCULAR | Status: DC | PRN
  Administered 2018-07-11: 15 mL

## 2018-07-11 MED ORDER — NITROGLYCERIN 0.4 MG SL SUBL
0.4000 mg | SUBLINGUAL_TABLET | SUBLINGUAL | Status: AC | PRN
  Administered 2018-07-11 (×2): 0.4 mg via SUBLINGUAL

## 2018-07-11 MED ORDER — DOXYCYCLINE HYCLATE 50 MG PO CAPS
50.0000 mg | ORAL_CAPSULE | Freq: Every day | ORAL | Status: DC
  Administered 2018-07-11: 50 mg via ORAL
  Filled 2018-07-11 (×2): qty 1

## 2018-07-11 MED ORDER — MIDAZOLAM HCL 2 MG/2ML IJ SOLN
INTRAMUSCULAR | Status: DC | PRN
  Administered 2018-07-11: 1 mg via INTRAVENOUS

## 2018-07-11 MED ORDER — HYDRALAZINE HCL 20 MG/ML IJ SOLN: 10.0000 mg | INTRAMUSCULAR | Status: AC | PRN

## 2018-07-11 MED ORDER — IOHEXOL 350 MG/ML SOLN
INTRAVENOUS | Status: DC | PRN
  Administered 2018-07-11: 50 mL via INTRA_ARTERIAL

## 2018-07-11 MED ORDER — HEPARIN (PORCINE) IN NACL 1000-0.9 UT/500ML-% IV SOLN
INTRAVENOUS | Status: AC
  Filled 2018-07-11: qty 1000

## 2018-07-11 MED ORDER — SODIUM CHLORIDE 0.9% FLUSH
3.0000 mL | Freq: Two times a day (BID) | INTRAVENOUS | Status: DC
  Administered 2018-07-11: 3 mL via INTRAVENOUS

## 2018-07-11 MED ORDER — TECHNETIUM TC 99M TETROFOSMIN IV KIT
30.0000 | PACK | Freq: Once | INTRAVENOUS | Status: AC | PRN
  Administered 2018-07-11: 30 via INTRAVENOUS

## 2018-07-11 MED ORDER — MIDAZOLAM HCL 2 MG/2ML IJ SOLN
INTRAMUSCULAR | Status: AC
  Filled 2018-07-11: qty 2

## 2018-07-11 MED ORDER — SODIUM CHLORIDE 0.9 % WEIGHT BASED INFUSION
1.0000 mL/kg/h | INTRAVENOUS | Status: DC
  Administered 2018-07-11: 1 mL/kg/h via INTRAVENOUS

## 2018-07-11 MED ORDER — REGADENOSON 0.4 MG/5ML IV SOLN
INTRAVENOUS | Status: AC
  Administered 2018-07-11: 0.4 mg via INTRAVENOUS
  Filled 2018-07-11: qty 5

## 2018-07-11 MED ORDER — NITROFURANTOIN MONOHYD MACRO 100 MG PO CAPS
100.0000 mg | ORAL_CAPSULE | Freq: Every day | ORAL | Status: DC
  Administered 2018-07-12: 100 mg via ORAL
  Filled 2018-07-11: qty 1

## 2018-07-11 MED ORDER — SODIUM CHLORIDE 0.9 % WEIGHT BASED INFUSION: 3.0000 mL/kg/h | INTRAVENOUS | Status: AC

## 2018-07-11 MED ORDER — SODIUM CHLORIDE 0.9 % IV SOLN: INTRAVENOUS | Status: AC

## 2018-07-11 SURGICAL SUPPLY — 9 items
CATH INFINITI 5FR MULTPACK ANG (CATHETERS) ×2 IMPLANT
COVER DOME SNAP 22 D (MISCELLANEOUS) IMPLANT
KIT HEART LEFT (KITS) ×2 IMPLANT
PACK CARDIAC CATHETERIZATION (CUSTOM PROCEDURE TRAY) ×2 IMPLANT
SHEATH PINNACLE 5F 10CM (SHEATH) ×2 IMPLANT
SHEATH PROBE COVER 6X72 (BAG) ×2 IMPLANT
SYR MEDRAD MARK 7 150ML (SYRINGE) ×2 IMPLANT
TRANSDUCER W/STOPCOCK (MISCELLANEOUS) ×2 IMPLANT
WIRE EMERALD 3MM-J .035X150CM (WIRE) ×2 IMPLANT

## 2018-07-11 NOTE — Progress Notes (Signed)
Patient hypotensive and symptomatic with hypotension. Fluid bolus given.

## 2018-07-11 NOTE — Progress Notes (Signed)
Around 0930, RN was called by nuclear med staff of patient complaining of chest pain. This RN arrived to area and patient chest pain was 2 out of 10, radiating to back and down arm. Patient placed in stretcher and on monitor. Oxygen applied, MD called and ECG ordered. Patient pain continued to worsen. Verbal obtained for nitro SL Nitro. Pain is subsiding after nitro given. MD now at bedside. RN remains at bedside. Will continue to monitor.

## 2018-07-11 NOTE — Progress Notes (Signed)
     5 Fr sheath was pulled from the R FA By Suella Broad RN. Manual pressure was held for 20 min. Sterile gauze was applied at the insertion site. R groin is soft and non tender. R DP was palpable before and after the sheath pull.    Bed rest started at 1630 X 4 hr. Instructions were given to patient about her bed rest.   HR 80 NSR  BP 141/73  sPO2 96% on R/A

## 2018-07-11 NOTE — Progress Notes (Signed)
[facility] PROGRESS NOTE  Nina Rush CHY:[id number] DOB: 10-05-1940 DOA: 07/10/2018 PCP: Derrill Center., MD  Assessment/Plan: Chest pain: Resolved. concern for ACS. Heart score 5. Chest pain resolved on its own after 1 hour and 15 minutes.  EKG noting new T wave inversion in aVL and  troponin negative x4. Chest x-ray otherwise clear. Stress test today. Of note experienced chest pain and hypotension after stress test. Provided with 500cc bolus and nitro and ekg done. Lipid panel with cholesterol 205,LDL 106.  Previously seen by Dr. Wyline Copas at [facility].  -follow stress test -for cardiac cath either today or tomorrow per patient - follow  echocardiogram -Nitroglycerin as needed  - cards   Essential hypertension: Blood pressures dropped as noted above. Home meds include lasix, lisinopril. -will hold lisinopril and furosemide  Hyperlipidemia: Patient not on any medication for treatment. -see #1   Code Status: full Family Communication:  Disposition Plan: home tomorrow   Consultants:  Doylene Canard cards  Procedures:  Echo  Stress test  Antibiotics:    HPI/Subjective:   Objective: Vitals:   07/11/18 1040 07/11/18 1050  BP: 131/65 139/68  Pulse:    Resp: 20 16  Temp:    SpO2: 100% 100%   No intake or output data in the 24 hours ending 07/11/18 1130 Filed Weights   07/10/18 1427 07/11/18 0515  Weight: 51.5 kg 50.9 kg    Exam:   General:  Awake alert no acute distress  Cardiovascular: rrr + mgr trace  LE edema  Respiratory: Normal effort BS clear bilaterally no wheeze  Abdomen: non-distended non-tender +BS no guarding or rebounding  Musculoskeletal: joints without swelling/erythema   Data Reviewed: Basic Metabolic Panel: Recent Labs  Lab 07/10/18 1033  NA 138  K 3.6  CL 103  CO2 27  GLUCOSE 105*  BUN 18  CREATININE <0.30*  CALCIUM 9.5   Liver Function Tests: Recent Labs  Lab 07/10/18 1033  AST 22  ALT 20  ALKPHOS 54  BILITOT 0.6  PROT 6.8   ALBUMIN 4.5   Recent Labs  Lab 07/10/18 1033  LIPASE 30   No results for input(s): AMMONIA in the last 168 hours. CBC: Recent Labs  Lab 07/10/18 1033  WBC 6.5  NEUTROABS 4.8  HGB 13.5  HCT 41.7  MCV 96.1  PLT 212   Cardiac Enzymes: Recent Labs  Lab 07/10/18 1033 07/10/18 1540 07/10/18 2147 07/11/18 0303  TROPONINI <0.03 <0.03 <0.03 <0.03   BNP (last 3 results) No results for input(s): BNP in the last 8760 hours.  ProBNP (last 3 results) No results for input(s): PROBNP in the last 8760 hours.  CBG: No results for input(s): GLUCAP in the last 168 hours.  Recent Results (from the past 240 hour(s))  SARS Coronavirus 2 (Hosp order,Performed in [facility] lab via [employer] ID)     Status: None   Collection Time: 07/10/18 11:20 AM  Result Value Ref Range Status   SARS Coronavirus 2 ([employer] ID Now) NEGATIVE NEGATIVE Final    Comment: (NOTE) Interpretive Result Comment(s): COVID 19 Positive SARS CoV 2 target nucleic acids are DETECTED. The SARS CoV 2 RNA is generally detectable in upper and lower respiratory specimens during the acute phase of infection.  Positive results are indicative of active infection with SARS CoV 2.  Clinical correlation with patient history and other diagnostic information is necessary to determine patient infection status.  Positive results do not rule out bacterial infection or coinfection with other viruses. The expected result is Negative.  COVID 19 Negative SARS CoV 2 target nucleic acids are NOT DETECTED. The SARS CoV 2 RNA is generally detectable in upper and lower respiratory specimens during the acute phase of infection.  Negative results do not preclude SARS CoV 2 infection, do not rule out coinfections with other pathogens, and should not be used as the sole basis for treatment or other patient management decisions.  Negative results must be combined with clinical  observations, patient history, and epidemiological  information. The expected result is Negative. Invalid Presence or absence of SARS CoV 2 nucleic acids cannot be determined. Repeat testing was performed on the submitted specimen and repeated Invalid results were obtained.  If clinically indicated, additional testing on a new specimen with an alternate test methodology [phone]) is advised.  The SARS CoV 2 RNA is generally detectable in upper and lower respiratory specimens during the acute phase of infection. The expected result is Negative. Fact Sheet for Patients:  GolfingFamily.no Fact Sheet for Healthcare Providers: https://www.hernandez-brewer.com/ This test is not yet approved or cleared by the [place] FDA and has been authorized for detection and/or diagnosis of SARS CoV 2 by FDA under an Emergency Use Authorization (EUA).  This EUA will remain in effect (meaning this test can be used) for the duration of the COVID19 d eclaration under Section 564(b)(1) of the Act, 21 U.S.C. section [phone] 3(b)(1), unless the authorization is terminated or revoked sooner. Performed at [facility], [address] [place], [place] [address]      Studies: Dg Chest 2 View  Result Date: 07/10/2018 CLINICAL DATA:  Chest pain EXAM: CHEST - 2 VIEW COMPARISON:  03/24/2017 FINDINGS: Normal heart size. Lungs clear. No pneumothorax. No pleural effusion. IMPRESSION: No active cardiopulmonary disease. Electronically Signed   By: Marybelle Killings M.D.   On: 07/10/2018 11:02    Scheduled Meds: . enoxaparin (LOVENOX) injection  40 mg Subcutaneous Q24H  . furosemide  20 mg Oral Daily  . lisinopril  10 mg Oral Daily  . mirabegron ER  25 mg Oral QHS  . potassium chloride  10 mEq Oral Daily   Continuous Infusions:  Principal Problem:   Chest pain Active Problems:   Essential hypertension   ACS (acute coronary syndrome) ([place])    Time spent: 35 minutes    [place] NP [facility]  If  7PM-7AM, please contact night-coverage at [email or web address], password [facility] 07/11/2018, 11:30 AM  LOS: 0 days

## 2018-07-11 NOTE — Progress Notes (Signed)
Pt R groin is soft, non tender, level 0. Instructed the pt to remain on bed rest x 4hr and to avoid movement with R leg. Pt verbalized understanding. Will continue to monitor

## 2018-07-11 NOTE — Progress Notes (Signed)
  Echocardiogram 2D Echocardiogram has been performed.  Nina Rush 07/11/2018, 2:36 PM

## 2018-07-11 NOTE — Progress Notes (Signed)
Report given to Dixie, RN about patients condition in Radiology.

## 2018-07-11 NOTE — Progress Notes (Signed)
Lexiscan portion on test complete. Patient in no distress.

## 2018-07-12 ENCOUNTER — Encounter (HOSPITAL_COMMUNITY): Payer: Self-pay | Admitting: Cardiovascular Disease

## 2018-07-12 DIAGNOSIS — I2510 Atherosclerotic heart disease of native coronary artery without angina pectoris: Secondary | ICD-10-CM | POA: Diagnosis present

## 2018-07-12 DIAGNOSIS — Z9841 Cataract extraction status, right eye: Secondary | ICD-10-CM | POA: Diagnosis not present

## 2018-07-12 DIAGNOSIS — Z8249 Family history of ischemic heart disease and other diseases of the circulatory system: Secondary | ICD-10-CM | POA: Diagnosis not present

## 2018-07-12 DIAGNOSIS — Z880 Allergy status to penicillin: Secondary | ICD-10-CM | POA: Diagnosis not present

## 2018-07-12 DIAGNOSIS — Z1159 Encounter for screening for other viral diseases: Secondary | ICD-10-CM | POA: Diagnosis not present

## 2018-07-12 DIAGNOSIS — R072 Precordial pain: Secondary | ICD-10-CM | POA: Diagnosis present

## 2018-07-12 DIAGNOSIS — E785 Hyperlipidemia, unspecified: Secondary | ICD-10-CM | POA: Diagnosis present

## 2018-07-12 DIAGNOSIS — Z90710 Acquired absence of both cervix and uterus: Secondary | ICD-10-CM | POA: Diagnosis not present

## 2018-07-12 DIAGNOSIS — R600 Localized edema: Secondary | ICD-10-CM | POA: Diagnosis present

## 2018-07-12 DIAGNOSIS — I249 Acute ischemic heart disease, unspecified: Secondary | ICD-10-CM | POA: Diagnosis present

## 2018-07-12 DIAGNOSIS — I10 Essential (primary) hypertension: Secondary | ICD-10-CM | POA: Diagnosis present

## 2018-07-12 DIAGNOSIS — G600 Hereditary motor and sensory neuropathy: Secondary | ICD-10-CM | POA: Diagnosis present

## 2018-07-12 DIAGNOSIS — Z823 Family history of stroke: Secondary | ICD-10-CM | POA: Diagnosis not present

## 2018-07-12 DIAGNOSIS — E7800 Pure hypercholesterolemia, unspecified: Secondary | ICD-10-CM | POA: Diagnosis present

## 2018-07-12 DIAGNOSIS — R079 Chest pain, unspecified: Secondary | ICD-10-CM | POA: Diagnosis not present

## 2018-07-12 DIAGNOSIS — Z9842 Cataract extraction status, left eye: Secondary | ICD-10-CM | POA: Diagnosis not present

## 2018-07-12 DIAGNOSIS — Z882 Allergy status to sulfonamides status: Secondary | ICD-10-CM | POA: Diagnosis not present

## 2018-07-12 DIAGNOSIS — Z881 Allergy status to other antibiotic agents status: Secondary | ICD-10-CM | POA: Diagnosis not present

## 2018-07-12 DIAGNOSIS — Z79899 Other long term (current) drug therapy: Secondary | ICD-10-CM | POA: Diagnosis not present

## 2018-07-12 DIAGNOSIS — Z9049 Acquired absence of other specified parts of digestive tract: Secondary | ICD-10-CM | POA: Diagnosis not present

## 2018-07-12 DIAGNOSIS — Z801 Family history of malignant neoplasm of trachea, bronchus and lung: Secondary | ICD-10-CM | POA: Diagnosis not present

## 2018-07-12 LAB — CBC
HCT: 34.5 % — ABNORMAL LOW (ref 36.0–46.0)
Hemoglobin: 11.4 g/dL — ABNORMAL LOW (ref 12.0–15.0)
MCH: 31.2 pg (ref 26.0–34.0)
MCHC: 33 g/dL (ref 30.0–36.0)
MCV: 94.5 fL (ref 80.0–100.0)
Platelets: 192 10*3/uL (ref 150–400)
RBC: 3.65 MIL/uL — ABNORMAL LOW (ref 3.87–5.11)
RDW: 13 % (ref 11.5–15.5)
WBC: 5 10*3/uL (ref 4.0–10.5)
nRBC: 0 % (ref 0.0–0.2)

## 2018-07-12 LAB — BASIC METABOLIC PANEL
Anion gap: 9 (ref 5–15)
BUN: 11 mg/dL (ref 8–23)
CO2: 22 mmol/L (ref 22–32)
Calcium: 8.5 mg/dL — ABNORMAL LOW (ref 8.9–10.3)
Chloride: 111 mmol/L (ref 98–111)
Creatinine, Ser: 0.48 mg/dL (ref 0.44–1.00)
GFR calc Af Amer: >60 mL/min (ref >60)
GFR calc non Af Amer: >60 mL/min (ref >60)
Glucose, Bld: 92 mg/dL (ref 70–99)
Potassium: 3.8 mmol/L (ref 3.5–5.1)
Sodium: 142 mmol/L (ref 135–145)

## 2018-07-12 MED ORDER — AMLODIPINE BESYLATE 2.5 MG PO TABS: 2.5000 mg | ORAL_TABLET | Freq: Every day | ORAL | Status: DC

## 2018-07-12 MED ORDER — ATORVASTATIN CALCIUM 10 MG PO TABS: 20.0000 mg | ORAL_TABLET | Freq: Every day | ORAL | Status: DC

## 2018-07-12 MED ORDER — ATORVASTATIN CALCIUM 20 MG PO TABS: 20.0000 mg | ORAL_TABLET | Freq: Every day | ORAL | 1 refills | Status: AC

## 2018-07-12 MED ORDER — AMLODIPINE BESYLATE 2.5 MG PO TABS: 2.5000 mg | ORAL_TABLET | Freq: Every day | ORAL | 1 refills | Status: AC

## 2018-07-12 NOTE — Care Management Obs Status (Signed)
[place] NOTIFICATION   Patient Details  Name: Nina Rush MRN: [id number] Date of Birth: 04-30-1940   Medicare Observation Status Notification Given:  Yes    Royston Bake, RN 07/12/2018, 11:02 AM

## 2018-07-12 NOTE — Consult Note (Signed)
Ref: Derrill Center., MD   Subjective:  No chest pain, no leg edema. VS stable. CBC and BMET stable. LDL cholesterol 106. Cardiac cath showed miild CAD in LCx and small RCA with good LV systolic function. Echocardiogram showed mild LVh and good systolic function.  Objective:  Vital Signs in the last 24 hours: Temp:  [97.7 F (36.5 C)-98.5 F (36.9 C)] 98 F (36.7 C) (06/02 0639) Pulse Rate:  [0-88] 77 (06/02 0639) Cardiac Rhythm: Normal sinus rhythm (06/02 0700) Resp:  [11-20] 18 (06/01 1611) BP: (90-161)/(53-92) 118/53 (06/02 0639) SpO2:  [0 %-100 %] 98 % (06/02 0936) Weight:  [50.9 kg-51.5 kg] 51.5 kg (06/02 0643)  Physical Exam: BP Readings from Last 1 Encounters:  07/12/18 (!) 118/53     Wt Readings from Last 1 Encounters:  07/12/18 51.5 kg    Weight change: -0.629 kg Body mass index is 20.12 kg/m. HEENT: [place]/AT, Eyes-Blue, PERL, EOMI, Conjunctiva-Pink, Sclera-Non-icteric Neck: No JVD, No bruit, Trachea midline. Lungs:  Clear, Bilateral. Cardiac:  Regular rhythm, normal S1 and S2, no S3. II/VI systolic murmur. Abdomen:  Soft, non-tender. BS present. Extremities:  No edema present. No cyanosis. No clubbing. Distal toes and fingers are little cooler with good radial and DP pulses. CNS: AxOx3, Cranial nerves grossly intact, moves all 4 extremities.  Skin: Warm and dry.   Intake/Output from previous day: 06/01 0701 - 06/02 0700 In: 1142.7 [P.O.:720; I.V.:422.7] Out: 500 [Urine:500]    Lab Results: BMET    Component Value Date/Time   NA 142 07/12/2018 0613   NA 138 07/10/2018 1033   NA 139 03/23/2017 2332   K 3.8 07/12/2018 0613   K 3.6 07/10/2018 1033   K 3.9 03/23/2017 2332   CL 111 07/12/2018 0613   CL 103 07/10/2018 1033   CL 101 03/23/2017 2332   CO2 22 07/12/2018 0613   CO2 27 07/10/2018 1033   CO2 28 03/23/2017 2332   GLUCOSE 92 07/12/2018 0613   GLUCOSE 105 (H) 07/10/2018 1033   GLUCOSE 142 (H) 03/23/2017 2332   BUN 11 07/12/2018 0613   BUN 18  07/10/2018 1033   BUN 19 03/23/2017 2332   CREATININE 0.48 07/12/2018 0613   CREATININE <0.30 (L) 07/10/2018 1033   CREATININE 0.46 03/23/2017 2332   CALCIUM 8.5 (L) 07/12/2018 0613   CALCIUM 9.5 07/10/2018 1033   CALCIUM 9.7 03/23/2017 2332   GFRNONAA >60 07/12/2018 0613   GFRNONAA NOT CALCULATED 07/10/2018 1033   GFRNONAA >60 03/23/2017 2332   GFRAA >60 07/12/2018 0613   GFRAA NOT CALCULATED 07/10/2018 1033   GFRAA >60 03/23/2017 2332   CBC    Component Value Date/Time   WBC 5.0 07/12/2018 0613   RBC 3.65 (L) 07/12/2018 0613   HGB 11.4 (L) 07/12/2018 0613   HCT 34.5 (L) 07/12/2018 0613   PLT 192 07/12/2018 0613   MCV 94.5 07/12/2018 0613   MCH 31.2 07/12/2018 0613   MCHC 33.0 07/12/2018 0613   RDW 13.0 07/12/2018 0613   LYMPHSABS 1.1 07/10/2018 1033   MONOABS 0.4 07/10/2018 1033   EOSABS 0.2 07/10/2018 1033   BASOSABS 0.0 07/10/2018 1033   HEPATIC Function Panel Recent Labs    07/10/18 1033  PROT 6.8   HEMOGLOBIN A1C No components found for: HGA1C,  MPG CARDIAC ENZYMES Lab Results  Component Value Date   CKTOTAL 117 05/17/2011   CKMB 4.7 (H) 05/17/2011   TROPONINI <0.03 07/11/2018   TROPONINI <0.03 07/10/2018   TROPONINI <0.03 07/10/2018   BNP No  results for input(s): PROBNP in the last 8760 hours. TSH No results for input(s): TSH in the last 8760 hours. CHOLESTEROL Recent Labs    07/11/18 0303  CHOL 205*    Scheduled Meds: . amLODipine  2.5 mg Oral Daily  . atorvastatin  20 mg Oral q1800  . doxycycline  50 mg Oral QHS  . enoxaparin (LOVENOX) injection  40 mg Subcutaneous Q24H  . mirabegron ER  25 mg Oral QHS  . nitrofurantoin (macrocrystal-monohydrate)  100 mg Oral Daily  . potassium chloride  10 mEq Oral Daily  . sodium chloride flush  3 mL Intravenous Q12H   Continuous Infusions: . sodium chloride     PRN Meds:.sodium chloride, acetaminophen, alum & mag hydroxide-simeth, morphine injection, nitroGLYCERIN, ondansetron (ZOFRAN) IV, sodium  chloride flush  Assessment/Plan: Chest pain Mild CAD Hypertension Hyperlipidemia  Aspirin as tolerated, Atorvastatin 20 mg. For mildly elevated LDL cholesterol and CAD, amlodipine 2.5 mg. For  Possible coronary vasospasm giving chest pain. F/U in 1 week. Patient and husbands questions answered.   LOS: 0 days    Dixie Dials  MD  07/12/2018, 10:53 AM

## 2018-07-12 NOTE — Discharge Summary (Signed)
Physician Discharge Summary  Nina Rush DSK:[id number] DOB: October 22, 1940 DOA: 07/10/2018  PCP: Derrill Center., MD  Admit date: 07/10/2018 Discharge date: 07/12/2018  Time spent: 45 minutes  Recommendations for Outpatient Follow-up:  1. Follow up with Cardiology 1 week for evaluation of symptoms 2.    Discharge Diagnoses:  Principal Problem:   Chest pain Active Problems:   Essential hypertension   ACS (acute coronary syndrome) [facility])   Discharge Condition: stable  Diet recommendation: heart healthy  Filed Weights   07/11/18 0515 07/11/18 1300 07/12/18 0643  Weight: 50.9 kg 50.9 kg 51.5 kg    History of present illness:  Nina Rush is a 78 y.o. female with medical history significant of HTN, Nina Rush tooth disease, and lichen planus; who presented on 5/31 with complaints of chest pain that started that morming.  She described pain as a substernal pressure feeling with some radiation to the back.  Denied having any nausea, vomiting, abdominal pain, lightheadedness, or diaphoresis symptoms.  She took an 81 mg aspirin without relief of symptoms.  Pain ultimately resolved on its own after about an hour and 15 minutes.  Noted having similar pain symptoms at the first part of last week that were short lasting and resolved after aspirin.  Other associated symptoms included over the previous month intermittant tingling in her left hand, mild lower extremity edema, and waking up hot.  Did note the tingling was related to her [place] Longest tooth disease.   Normally follows with cardiology at [facility].  Reported having a negative stress test several years ago.  At baseline patient ambulates with use of a rolling walker.  Hospital Course:  Chest pain: Resolved.concern for ACS. Heart score 5. Chest pain resolved on its own after 1 hour and 15 minutes. EKG noting new T wave inversion in aVL and  troponin negative x4. Chestx-ray otherwise clear. Stress test on 6/1 interpreted as  normal and cardiac cath 6/1 with no significant CAD.  Of note experienced chest pain and hypotension after stress test. Provided with 500cc bolus and nitro and ekg done. Lipid panel with cholesterol 205,LDL 106.  [facility] adjusted per cards ie. Stop ntg, no BB start low dose amlodipine and continue statin and ace inhibitor.  Follow up with cards 1 week.    Essentialhypertension: Blood pressures dropped as noted above. Home meds include lasix, lisinopril. Medications adjusted as noted above. At discharge BP 118/53.  Hyperlipidemia: statin started  Procedures: Stress test 6/1 Cardiac cath 6.1   Consultations:  Dr Doylene Canard cardiology  Discharge Exam: Vitals:   07/12/18 0639 07/12/18 0936  BP: (!) 118/53   Pulse: 77   Resp:    Temp: 98 F (36.7 C)   SpO2: 97% 98%    General: sitting on side of bed eating breakfast Cardiovascular: rrr no mgr no LE edema Respiratory: normal effort BS clear bilaterally no wheeze  Discharge Instructions   Discharge Instructions    Call MD for:  difficulty breathing, headache or visual disturbances   Complete by:  As directed    Call MD for:  persistant dizziness or light-headedness   Complete by:  As directed    Call MD for:  persistant nausea and vomiting   Complete by:  As directed    Call MD for:  severe uncontrolled pain   Complete by:  As directed    Diet - low sodium heart healthy   Complete by:  As directed  Discharge instructions   Complete by:  As directed    Take medications as prescribed Follow up with Dr Doylene Canard 1 week   Increase activity slowly   Complete by:  As directed      Allergies as of 07/12/2018      Reactions   Ciprofloxacin Other (See Comments)   Possibly rash   Penicillins Rash   Did it involve swelling of the face/tongue/throat, SOB, or low BP? Unknown Did it involve sudden or severe rash/hives, skin peeling, or any reaction on the inside of your mouth or nose? Yes Did  you need to seek medical attention at a hospital or doctor's office? No When did it last happen?approx 78 years old If all above answers are "NO", may proceed with cephalosporin use.   Sulfa Antibiotics Rash      Medication List    TAKE these medications   amLODipine 2.5 MG tablet Commonly known as:  NORVASC Take 1 tablet (2.5 mg total) by mouth daily.   aspirin EC 81 MG tablet Take 81 mg by mouth See admin instructions. Take one tablet (81 mg) by mouth every other night   atorvastatin 20 MG tablet Commonly known as:  LIPITOR Take 1 tablet (20 mg total) by mouth daily at 6 PM.   clobetasol 0.05 % external solution Commonly known as:  TEMOVATE Apply 1 application topically daily as needed (scalp itching).   cycloSPORINE 0.05 % ophthalmic emulsion Commonly known as:  RESTASIS Place 1 drop into both eyes 2 (two) times daily.   doxycycline 50 MG capsule Commonly known as:  VIBRAMYCIN Take 50 mg by mouth at bedtime.   estradiol 0.1 MG/GM vaginal cream Commonly known as:  ESTRACE Place vaginally See admin instructions. Apply pea size amount vaginally every other night   furosemide 20 MG tablet Commonly known as:  LASIX Take 40 mg by mouth every other day.   lisinopril 10 MG tablet Commonly known as:  ZESTRIL Take 10 mg by mouth daily.   mirabegron ER 50 MG Tb24 tablet Commonly known as:  MYRBETRIQ Take 50 mg by mouth at bedtime.   multivitamin with minerals Tabs tablet Take 1 tablet by mouth daily.   nitrofurantoin 100 MG capsule Commonly known as:  MACRODANTIN Take 100 mg by mouth daily.   OMEGA 3 PO Take 1 capsule by mouth daily.   OVER THE COUNTER MEDICATION Place 1 application into both eyes at bedtime. Over the counter eye ointment   OVER THE COUNTER MEDICATION Place 1 drop into both eyes 4 (four) times daily as needed (dry eyes). Over the counter lubricating eye drops   oxybutynin 5 MG tablet Commonly known as:  DITROPAN Take 5 mg by mouth  daily.   potassium chloride 10 MEQ tablet Commonly known as:  K-DUR Take 20 mEq by mouth every other day.      Allergies  Allergen Reactions  . Ciprofloxacin Other (See Comments)    Possibly rash  . Penicillins Rash    Did it involve swelling of the face/tongue/throat, SOB, or low BP? Unknown Did it involve sudden or severe rash/hives, skin peeling, or any reaction on the inside of your mouth or nose? Yes Did you need to seek medical attention at a hospital or doctor's office? No When did it last happen?approx 78 years old If all above answers are "NO", may proceed with cephalosporin use.  Ignacia Bayley Antibiotics Rash   Follow-up Information    Derrill Center., MD.   Specialty:  [facility] Medicine  Why:  Office will call patient Contact information: [address] [place] [address] [phone]        Dixie Dials, MD. Schedule an appointment as soon as possible for a visit in 1 week(s).   Specialty:  Cardiology Contact information: [place] [address] [phone]            The results of significant diagnostics from this hospitalization (including imaging, microbiology, ancillary and laboratory) are listed below for reference.    Significant Diagnostic Studies: Dg Chest 2 View  Result Date: 07/10/2018 CLINICAL DATA:  Chest pain EXAM: CHEST - 2 VIEW COMPARISON:  03/24/2017 FINDINGS: Normal heart size. Lungs clear. No pneumothorax. No pleural effusion. IMPRESSION: No active cardiopulmonary disease. Electronically Signed   By: Marybelle Killings M.D.   On: 07/10/2018 11:02   Nm Myocar Multi W/spect W/wall Motion / Ef  Result Date: 07/11/2018 CLINICAL DATA:  Substernal chest pain x1 day, negative troponin EXAM: MYOCARDIAL IMAGING WITH SPECT (REST AND PHARMACOLOGIC-STRESS) GATED LEFT VENTRICULAR WALL MOTION STUDY LEFT VENTRICULAR EJECTION FRACTION TECHNIQUE: Standard myocardial SPECT imaging was performed after resting intravenous injection of  10 mCi Tc-49m tetrofosmin. Subsequently, intravenous infusion of Lexiscan was performed under the supervision of the Cardiology staff. At peak effect of the drug, 30 mCi Tc-64m tetrofosmin was injected intravenously and standard myocardial SPECT imaging was performed. Quantitative gated imaging was also performed to evaluate left ventricular wall motion, and estimate left ventricular ejection fraction. COMPARISON:  None. FINDINGS: Perfusion: No decreased activity in the left ventricle on stress imaging to suggest reversible ischemia or infarction. Wall Motion: Normal left ventricular wall motion. No left ventricular dilation. Left Ventricular Ejection Fraction: 68 % End diastolic volume 59 ml End systolic volume 19 ml IMPRESSION: 1. No reversible ischemia or infarction. 2. Normal left ventricular wall motion. 3. Left ventricular ejection fraction 68% 4. Non invasive risk stratification*: Low *2012 Appropriate Use Criteria for Coronary Revascularization Focused Update: J Am Coll Cardiol. 3151;76(1):607-371. http://content.airportbarriers.com.aspx?articleid=[email or web address] Electronically Signed   By: Julian Hy M.D.   On: 07/11/2018 13:24    Microbiology: Recent Results (from the past 240 hour(s))  SARS Coronavirus 2 (Hosp order,Performed in [facility] lab via [employer])     Status: None   Collection Time: 07/10/18 11:20 AM  Result Value Ref Range Status   SARS Coronavirus 2 ([employer] Now) NEGATIVE NEGATIVE Final    Comment: (NOTE) Interpretive Result Comment(s): COVID 19 Positive SARS CoV 2 target nucleic acids are DETECTED. The SARS CoV 2 RNA is generally detectable in upper and lower respiratory specimens during the acute phase of infection.  Positive results are indicative of active infection with SARS CoV 2.  Clinical correlation with patient history and other diagnostic information is necessary to determine patient infection status.  Positive results do not rule out bacterial infection or  coinfection with other viruses. The expected result is Negative. COVID 19 Negative SARS CoV 2 target nucleic acids are NOT DETECTED. The SARS CoV 2 RNA is generally detectable in upper and lower respiratory specimens during the acute phase of infection.  Negative results do not preclude SARS CoV 2 infection, do not rule out coinfections with other pathogens, and should not be used as the sole basis for treatment or other patient management decisions.  Negative results must be combined with clinical  observations, patient history, and epidemiological information. The expected result is Negative. Invalid Presence or absence of SARS CoV 2 nucleic acids cannot be determined. Repeat testing was performed on  the submitted specimen and repeated Invalid results were obtained.  If clinically indicated, additional testing on a new specimen with an alternate test methodology [phone]) is advised.  The SARS CoV 2 RNA is generally detectable in upper and lower respiratory specimens during the acute phase of infection. The expected result is Negative. Fact Sheet for Patients:  GolfingFamily.no Fact Sheet for Healthcare Providers: https://www.hernandez-brewer.com/ This test is not yet approved or cleared by the [place] FDA and has been authorized for detection and/or diagnosis of SARS CoV 2 by FDA under an Emergency Use Authorization (EUA).  This EUA will remain in effect (meaning this test can be used) for the duration of the COVID19 d eclaration under Section 564(b)(1) of the Act, 21 U.S.C. section [phone] 3(b)(1), unless the authorization is terminated or revoked sooner. Performed at [facility], [address] [place], [place] [address]      Labs: Basic Metabolic Panel: Recent Labs  Lab 07/10/18 1033 07/12/18 0613  NA 138 142  K 3.6 3.8  CL 103 111  CO2 27 22  GLUCOSE 105* 92  BUN 18 11  CREATININE <0.30* 0.48  CALCIUM 9.5  8.5*   Liver Function Tests: Recent Labs  Lab 07/10/18 1033  AST 22  ALT 20  ALKPHOS 54  BILITOT 0.6  PROT 6.8  ALBUMIN 4.5   Recent Labs  Lab 07/10/18 1033  LIPASE 30   No results for input(s): AMMONIA in the last 168 hours. CBC: Recent Labs  Lab 07/10/18 1033 07/12/18 0613  WBC 6.5 5.0  NEUTROABS 4.8  --   HGB 13.5 11.4*  HCT 41.7 34.5*  MCV 96.1 94.5  PLT 212 192   Cardiac Enzymes: Recent Labs  Lab 07/10/18 1033 07/10/18 1540 07/10/18 2147 07/11/18 0303  TROPONINI <0.03 <0.03 <0.03 <0.03   BNP: BNP (last 3 results) No results for input(s): BNP in the last 8760 hours.  ProBNP (last 3 results) No results for input(s): PROBNP in the last 8760 hours.  CBG: No results for input(s): GLUCAP in the last 168 hours.     SignedRadene Gunning NP [facility] 07/12/2018, 10:55 AM

## 2018-07-12 NOTE — Progress Notes (Signed)
Patient to discharge home today. All discharge information reviewed with patient.  Personal belongings with patient.  Patient exhibits no c/o or distress at this time.  Medications reviewed with patient and need for follow up appointments emphasized.

## 2018-07-12 NOTE — TOC Initial Note (Signed)
Transition of Care [facility]) - Initial/Assessment Note    Patient Details  Name: Nina Rush MRN: [id number] Date of Birth: [birth year]  [facility] of [facility]) CM/SW Contact:    Sherrilyn Rist Phone Number: [phone] 07/12/2018, 10:37 AM  Clinical Narrative:                 Patient lives at home with spouse; PCP is Dr Kathlen Mody; has private insurance with [employer] with prescription drug coverage; pharmacy of choice is [facility] in [place], [place]; DME - Rollater ( rolling walker with seat) at home; no needs identified at this time. CM will continue to follow for progression of care.  Expected Discharge Plan: Home/Self Care Barriers to Discharge: No Barriers Identified   Patient Goals and CMS Choice Patient states their goals for this hospitalization and ongoing recovery are:: to return home well CMS Medicare.gov Compare Post Acute Care list provided to:: Patient Choice offered to / list presented to : NA  Expected Discharge Plan and Services Expected Discharge Plan: Home/Self Care In-house Referral: NA Discharge Planning Services: CM Consult Post Acute Care Choice: NA Living arrangements for the past 2 months: Single Family Home                 DME Arranged: N/A DME Agency: NA       HH Arranged: NA HH Agency: NA        Prior Living Arrangements/Services Living arrangements for the past 2 months: Single Family Home Lives with:: Spouse Patient language and need for interpreter reviewed:: Yes Do you feel safe going back to the place where you live?: Yes      Need for Family Participation in Patient Care: Yes (Comment) Care giver support system in place?: Yes (comment)   Criminal Activity/Legal Involvement Pertinent to Current Situation/Hospitalization: No - Comment as needed  Activities of Daily Living Home Assistive Devices/Equipment: Walker (specify type) ADL Screening (condition at time of admission) Patient's cognitive ability adequate to safely  complete daily activities?: Yes Is the patient deaf or have difficulty hearing?: No Does the patient have difficulty seeing, even when wearing glasses/contacts?: No Does the patient have difficulty concentrating, remembering, or making decisions?: No Patient able to express need for assistance with ADLs?: Yes Does the patient have difficulty dressing or bathing?: No Independently performs ADLs?: Yes (appropriate for developmental age) Does the patient have difficulty walking or climbing stairs?: Yes Weakness of Legs: None Weakness of Arms/Hands: None  Permission Sought/Granted Permission sought to share information with : Case Manager Permission granted to share information with : Yes, Verbal Permission Granted              Emotional Assessment Appearance:: Developmentally appropriate Attitude/Demeanor/Rapport: Engaged, Gracious Affect (typically observed): Accepting Orientation: : Oriented to Self, Oriented to  Time, Oriented to Place, Oriented to Situation Alcohol / Substance Use: Not Applicable Psych Involvement: No (comment)  Admission diagnosis:  Substernal chest pain [R07.2] Patient Active Problem List   Diagnosis Date Noted  . ACS (acute coronary syndrome) ([place]) 07/11/2018  . Chest pain 07/10/2018  . Essential hypertension 07/10/2018   PCP:  Derrill Center., MD Pharmacy:   [facility] [address] [place] [address] Phone: [phone] Fax: [phone]     Social Determinants of Health (SDOH) Interventions    Readmission Risk Interventions No flowsheet data found.

## 2018-07-14 DIAGNOSIS — I2510 Atherosclerotic heart disease of native coronary artery without angina pectoris: Secondary | ICD-10-CM | POA: Diagnosis not present

## 2018-07-14 DIAGNOSIS — M810 Age-related osteoporosis without current pathological fracture: Secondary | ICD-10-CM | POA: Diagnosis not present

## 2018-07-19 DIAGNOSIS — I10 Essential (primary) hypertension: Secondary | ICD-10-CM | POA: Diagnosis not present

## 2018-07-19 DIAGNOSIS — R072 Precordial pain: Secondary | ICD-10-CM | POA: Diagnosis not present

## 2018-07-19 DIAGNOSIS — I2510 Atherosclerotic heart disease of native coronary artery without angina pectoris: Secondary | ICD-10-CM | POA: Diagnosis not present

## 2018-07-19 DIAGNOSIS — E7849 Other hyperlipidemia: Secondary | ICD-10-CM | POA: Diagnosis not present

## 2018-07-21 DIAGNOSIS — N3941 Urge incontinence: Secondary | ICD-10-CM | POA: Diagnosis not present

## 2018-07-25 DIAGNOSIS — G600 Hereditary motor and sensory neuropathy: Secondary | ICD-10-CM | POA: Diagnosis not present

## 2018-07-25 DIAGNOSIS — M7989 Other specified soft tissue disorders: Secondary | ICD-10-CM | POA: Diagnosis not present

## 2018-07-29 DIAGNOSIS — R072 Precordial pain: Secondary | ICD-10-CM | POA: Diagnosis not present

## 2018-08-06 ENCOUNTER — Encounter (HOSPITAL_BASED_OUTPATIENT_CLINIC_OR_DEPARTMENT_OTHER): Payer: Self-pay | Admitting: Adult Health

## 2018-08-06 ENCOUNTER — Emergency Department (HOSPITAL_BASED_OUTPATIENT_CLINIC_OR_DEPARTMENT_OTHER): Payer: PPO

## 2018-08-06 ENCOUNTER — Emergency Department (HOSPITAL_BASED_OUTPATIENT_CLINIC_OR_DEPARTMENT_OTHER)
Admission: EM | Admit: 2018-08-06 | Discharge: 2018-08-06 | Disposition: A | Discharge status: Home / Self Care | Payer: PPO | Attending: Emergency Medicine | Admitting: Emergency Medicine

## 2018-08-06 ENCOUNTER — Other Ambulatory Visit: Payer: Self-pay

## 2018-08-06 DIAGNOSIS — S99922A Unspecified injury of left foot, initial encounter: Secondary | ICD-10-CM | POA: Diagnosis not present

## 2018-08-06 DIAGNOSIS — Z79899 Other long term (current) drug therapy: Secondary | ICD-10-CM | POA: Diagnosis not present

## 2018-08-06 DIAGNOSIS — G600 Hereditary motor and sensory neuropathy: Secondary | ICD-10-CM | POA: Insufficient documentation

## 2018-08-06 DIAGNOSIS — M79672 Pain in left foot: Secondary | ICD-10-CM | POA: Diagnosis not present

## 2018-08-06 DIAGNOSIS — Z7982 Long term (current) use of aspirin: Secondary | ICD-10-CM | POA: Insufficient documentation

## 2018-08-06 DIAGNOSIS — I10 Essential (primary) hypertension: Secondary | ICD-10-CM | POA: Diagnosis not present

## 2018-08-06 DIAGNOSIS — S9782XA Crushing injury of left foot, initial encounter: Secondary | ICD-10-CM | POA: Diagnosis not present

## 2018-08-06 DIAGNOSIS — M7989 Other specified soft tissue disorders: Secondary | ICD-10-CM | POA: Diagnosis not present

## 2018-08-06 NOTE — ED Triage Notes (Signed)
Present with one week of left dorsal foot swelling and pain and erythema.  Pt reports that she had a crush injury to that foot from a shower head that fell on her foot one week ago. Since the redness and pain has gotten worse and the foot is very warm.

## 2018-08-06 NOTE — ED Notes (Signed)
Patient transported to CT 

## 2018-08-06 NOTE — ED Provider Notes (Signed)
[facility] EMERGENCY DEPARTMENT Provider Note   CSN: [id number] Arrival date & time: 08/06/18  1253    History   Chief Complaint Chief Complaint  Patient presents with   Foot Pain    HPI Nina Rush is a 78 y.o. female with history of Charcot [name] Tooth disease, hypertension who presents with left foot pain after a shower head fell on her foot last week.  She noticed swelling and bruising which has somewhat improved, however continues to have pain and with some associated swelling.  There is some associated redness as well.  She denies any fevers or numbness or tingling.  She has history of a osteotomy of the for her Charcot-Marie-Tooth 20 to 30 years ago, but no recent surgical history.     HPI  Past Medical History:  Diagnosis Date   Charcot-Marie-Tooth disease    Fuchs' adenoma    bilateral eyes   Hypertension    Lichen planus    scalp    Patient Active Problem List   Diagnosis Date Noted   ACS (acute coronary syndrome) ([place]) 07/11/2018   Chest pain 07/10/2018   Essential hypertension 07/10/2018    Past Surgical History:  Procedure Laterality Date   ABDOMINAL HYSTERECTOMY     total   APPENDECTOMY     CATARACT EXTRACTION Bilateral    CHOLECYSTECTOMY     LEFT HEART CATH AND CORONARY ANGIOGRAPHY N/A 07/11/2018   Procedure: LEFT HEART CATH AND CORONARY ANGIOGRAPHY;  Surgeon: Dixie Dials, MD;  Location: [facility];  Service: Cardiovascular;  Laterality: N/A;   LEG SURGERY     as child     OB History   No obstetric history on file.      Home Medications    Prior to Admission medications   Medication Sig Start Date End Date Taking? Authorizing Provider  amLODipine (NORVASC) 2.5 MG tablet Take 1 tablet (2.5 mg total) by mouth daily. 07/12/18   Radene Gunning, NP  aspirin EC 81 MG tablet Take 81 mg by mouth See admin instructions. Take one tablet (81 mg) by mouth every other night    [provider]  atorvastatin  (LIPITOR) 20 MG tablet Take 1 tablet (20 mg total) by mouth daily at 6 PM. 07/12/18   Black, Lezlie Octave, NP  clobetasol (TEMOVATE) 0.05 % external solution Apply 1 application topically daily as needed (scalp itching).  04/27/13   [provider]  cycloSPORINE (RESTASIS) 0.05 % ophthalmic emulsion Place 1 drop into both eyes 2 (two) times daily.    [provider]  doxycycline (VIBRAMYCIN) 50 MG capsule Take 50 mg by mouth at bedtime.  07/06/18   [provider]  estradiol (ESTRACE) 0.1 MG/GM vaginal cream Place vaginally See admin instructions. Apply pea size amount vaginally every other night    [provider]  furosemide (LASIX) 20 MG tablet Take 40 mg by mouth every other day.  10/18/15   [provider]  lisinopril (PRINIVIL,ZESTRIL) 10 MG tablet Take 10 mg by mouth daily.    [provider]  mirabegron ER (MYRBETRIQ) 50 MG TB24 tablet Take 50 mg by mouth at bedtime.     [provider]  Multiple Vitamin (MULTIVITAMIN WITH MINERALS) TABS tablet Take 1 tablet by mouth daily.    [provider]  nitrofurantoin (MACRODANTIN) 100 MG capsule Take 100 mg by mouth daily.    [provider]  Omega-3 Fatty Acids (OMEGA 3 PO) Take 1 capsule by mouth daily.  [provider]  OVER THE COUNTER MEDICATION Place 1 application into both eyes at bedtime. Over the counter eye ointment    [provider]  OVER THE COUNTER MEDICATION Place 1 drop into both eyes 4 (four) times daily as needed (dry eyes). Over the counter lubricating eye drops    [provider]  oxybutynin (DITROPAN) 5 MG tablet Take 5 mg by mouth daily. 05/12/18   [provider]  potassium chloride (K-DUR) 10 MEQ tablet Take 20 mEq by mouth every other day.     [provider]    Family History Family History  Problem Relation Age of Onset   Cancer - Lung Mother    Stroke Father     Social History Social History    Tobacco Use   Smoking status: Never Smoker   Smokeless tobacco: Never Used  Substance Use Topics   Alcohol use: No   Drug use: No     Allergies   Ciprofloxacin, Penicillins, and Sulfa antibiotics   Review of Systems Review of Systems  Constitutional: Negative for chills and fever.  HENT: Negative for facial swelling and sore throat.   Respiratory: Negative for shortness of breath.   Cardiovascular: Negative for chest pain.  Gastrointestinal: Negative for abdominal pain, nausea and vomiting.  Genitourinary: Negative for dysuria.  Musculoskeletal: Positive for arthralgias and joint swelling. Negative for back pain.  Skin: Positive for color change. Negative for rash and wound.  Neurological: Negative for headaches.  Psychiatric/Behavioral: The patient is not nervous/anxious.      Physical Exam Updated Vital Signs BP 127/70 (BP Location: Right Arm)    Pulse 81    Temp 98.4 F (36.9 C) (Oral)    Resp 18    Ht 5\' 3"  (1.6 m)    Wt 51.7 kg    SpO2 100%    BMI 20.19 kg/m   Physical Exam Vitals signs and nursing note reviewed.  Constitutional:      General: She is not in acute distress.    Appearance: She is well-developed. She is not diaphoretic.  HENT:     Head: Normocephalic and atraumatic.     Mouth/Throat:     Pharynx: No oropharyngeal exudate.  Eyes:     General: No scleral icterus.       Right eye: No discharge.        Left eye: No discharge.     Conjunctiva/sclera: Conjunctivae normal.     Pupils: Pupils are equal, round, and reactive to light.  Neck:     Musculoskeletal: Normal range of motion and neck supple.     Thyroid: No thyromegaly.  Cardiovascular:     Rate and Rhythm: Normal rate and regular rhythm.     Heart sounds: Normal heart sounds. No murmur. No friction rub. No gallop.   Pulmonary:     Effort: Pulmonary effort is normal. No respiratory distress.     Breath sounds: Normal breath sounds. No stridor. No wheezing or rales.  Abdominal:      General: Bowel sounds are normal. There is no distension.     Palpations: Abdomen is soft.     Tenderness: There is no abdominal tenderness. There is no guarding or rebound.  Musculoskeletal:       Feet:     Comments: Mild to moderate edema noted to the left foot with some mild erythema versus resolving ecchymosis to the first metatarsal; no break in the skin, very mild warmth Chaka-Marie-Tooth deformities bilaterally  Lymphadenopathy:  Cervical: No cervical adenopathy.  Skin:    General: Skin is warm and dry.     Coloration: Skin is not pale.     Findings: No rash.  Neurological:     Mental Status: She is alert.     Coordination: Coordination normal.      ED Treatments / Results  Labs (all labs ordered are listed, but only abnormal results are displayed) Labs Reviewed - No data to display  EKG None  Radiology Ct Foot Left Wo Contrast  Result Date: 08/06/2018 CLINICAL DATA:  Dorsal left foot pain, swelling, and erythema. Crush injury when a shower head fell on the dorsum of her foot 1 week ago. EXAM: CT OF THE LEFT FOOT WITHOUT CONTRAST TECHNIQUE: Multidetector CT imaging of the left foot was performed according to the standard protocol. Multiplanar CT image reconstructions were also generated. COMPARISON:  Radiographs dated 08/06/2018 FINDINGS: Bones/Joint/Cartilage There is no fracture or dislocation. Previous fusion of the talus with the calcaneus and cuboid as a child. Diffuse osteopenia. Previous bunion surgery. Ligaments Suboptimally assessed by CT. The ligaments of the lateral aspect of the ankle are intact. The deltoid ligament is not well seen. Muscles and Tendons The peroneal tendons are not present, probably due to prior surgery. The Achilles tendon and medial and anterior tendons are intact. Plantar fascia appears normal. Soft tissues Slight edema in the soft tissues of the dorsum of the foot and at the anterior aspect of the ankle, nonspecific. IMPRESSION: Soft tissue  edema on the dorsum of the foot and at the anterior aspect of the ankle. No acute bone abnormality. No definable abscess. Electronically Signed   By: Lorriane Shire M.D.   On: 08/06/2018 15:39   Dg Foot Complete Left  Result Date: 08/06/2018 CLINICAL DATA:  Injury, shower head fell on foot EXAM: LEFT FOOT - COMPLETE 3+ VIEW COMPARISON:  None. FINDINGS: Osteopenia. No definite displaced fracture or dislocation of the left foot. There is evidence of a prior hindfoot osteotomy and/or congenital fusion, and surgical suture about the left first metatarsal there is subtle angulation of the left fourth metatarsal head, of uncertain acuity, and callus formation of the mid left second metatarsal, consistent with prior stress fracture or reaction. Soft tissue edema about the forefoot. IMPRESSION: Osteopenia. No definite displaced fracture or dislocation of the left foot. There is evidence of a prior hindfoot osteotomy and/or congenital fusion, and surgical suture about the left first metatarsal there is subtle angulation of the left fourth metatarsal head, of uncertain acuity, and callus formation of the mid left second metatarsal, consistent with prior stress fracture or reaction. Soft tissue edema about the forefoot. MRI may be helpful to evaluate for bone marrow edema if fracture is suspected. Electronically Signed   By: Eddie Candle M.D.   On: 08/06/2018 13:49    Procedures Procedures (including critical care time)  Medications Ordered in ED Medications - No data to display   Initial Impression / Assessment and Plan / ED Course  I have reviewed the triage vital signs and the nursing notes.  Pertinent labs & imaging results that were available during my care of the patient were reviewed by me and considered in my medical decision making (see chart for details).        Patient presenting with left foot pain after trauma last week.  X-ray concerning for subacute fracture and has lots of postsurgical  changes.  CT was conducted for further evaluation which showed soft tissue edema on the dorsum  of the foot and anterior aspect of the ankle, but no acute bony abnormality or definable abscess.  Skin is mildly erythematous, however I suspect related to resolving ecchymosis.  There is no break in the skin and unlikely mechanism of infection.  Will refer to podiatry and treat with ice, elevation at this time.  Recommended Tylenol for pain.  Patient already has custom shoes for her Charcot [name] Tooth, so will defer postop shoe at this time.  Strict return precautions given including increasing or spreading redness, increasing pain or swelling, fevers.  Patient understands and agrees with plan.  Patient vitals stable throughout ED course and discharged in satisfactory condition.  Patient also evaluated by my attending, Dr. Rex Kras, who guided the patient's management and agrees with plan.  Final Clinical Impressions(s) / ED Diagnoses   Final diagnoses:  Foot pain, left    ED Discharge Orders    None       Caryl Ada 08/06/18 [place], Wenda Overland, MD 08/08/18 [phone]

## 2018-08-06 NOTE — Discharge Instructions (Signed)
Take Tylenol 500 mg every 6-8 hours as needed for pain.  Use ice 3-4 times daily alternating 20 minutes on, 20 minutes off.  Please follow-up with Dr. Amalia Hailey for further evaluation and treatment of your foot pain and swelling.  Please return the emergency department you develop any new or worsening symptoms including increasing pain, spreading of redness, fever over 100.4, or any other new or concerning symptoms.

## 2018-08-09 DIAGNOSIS — R600 Localized edema: Secondary | ICD-10-CM | POA: Diagnosis not present

## 2018-08-09 DIAGNOSIS — L03116 Cellulitis of left lower limb: Secondary | ICD-10-CM | POA: Diagnosis not present

## 2018-08-11 DIAGNOSIS — S9032XA Contusion of left foot, initial encounter: Secondary | ICD-10-CM | POA: Diagnosis not present

## 2018-08-18 DIAGNOSIS — S9030XA Contusion of unspecified foot, initial encounter: Secondary | ICD-10-CM | POA: Diagnosis not present

## 2018-08-25 DIAGNOSIS — G600 Hereditary motor and sensory neuropathy: Secondary | ICD-10-CM | POA: Diagnosis not present

## 2018-08-25 DIAGNOSIS — M7989 Other specified soft tissue disorders: Secondary | ICD-10-CM | POA: Diagnosis not present

## 2018-08-29 DIAGNOSIS — Z85828 Personal history of other malignant neoplasm of skin: Secondary | ICD-10-CM | POA: Diagnosis not present

## 2018-08-29 DIAGNOSIS — L821 Other seborrheic keratosis: Secondary | ICD-10-CM | POA: Diagnosis not present

## 2018-08-29 DIAGNOSIS — L661 Lichen planopilaris: Secondary | ICD-10-CM | POA: Diagnosis not present

## 2018-08-29 DIAGNOSIS — L570 Actinic keratosis: Secondary | ICD-10-CM | POA: Diagnosis not present

## 2018-08-29 DIAGNOSIS — Z08 Encounter for follow-up examination after completed treatment for malignant neoplasm: Secondary | ICD-10-CM | POA: Diagnosis not present

## 2018-09-06 DIAGNOSIS — H903 Sensorineural hearing loss, bilateral: Secondary | ICD-10-CM | POA: Diagnosis not present

## 2018-09-12 DIAGNOSIS — E785 Hyperlipidemia, unspecified: Secondary | ICD-10-CM | POA: Diagnosis not present

## 2018-09-12 DIAGNOSIS — I10 Essential (primary) hypertension: Secondary | ICD-10-CM | POA: Diagnosis not present

## 2018-09-12 DIAGNOSIS — R7301 Impaired fasting glucose: Secondary | ICD-10-CM | POA: Diagnosis not present

## 2018-09-19 DIAGNOSIS — H02201 Unspecified lagophthalmos right upper eyelid: Secondary | ICD-10-CM | POA: Diagnosis not present

## 2018-09-19 DIAGNOSIS — H1851 Endothelial corneal dystrophy: Secondary | ICD-10-CM | POA: Diagnosis not present

## 2018-09-19 DIAGNOSIS — Z961 Presence of intraocular lens: Secondary | ICD-10-CM | POA: Diagnosis not present

## 2018-09-19 DIAGNOSIS — H02204 Unspecified lagophthalmos left upper eyelid: Secondary | ICD-10-CM | POA: Diagnosis not present

## 2018-09-21 DIAGNOSIS — R7301 Impaired fasting glucose: Secondary | ICD-10-CM | POA: Diagnosis not present

## 2018-09-21 DIAGNOSIS — E785 Hyperlipidemia, unspecified: Secondary | ICD-10-CM | POA: Diagnosis not present

## 2018-09-21 DIAGNOSIS — I10 Essential (primary) hypertension: Secondary | ICD-10-CM | POA: Diagnosis not present

## 2018-09-29 DIAGNOSIS — N3946 Mixed incontinence: Secondary | ICD-10-CM | POA: Diagnosis not present

## 2018-10-03 DIAGNOSIS — Z20828 Contact with and (suspected) exposure to other viral communicable diseases: Secondary | ICD-10-CM | POA: Diagnosis not present

## 2018-10-25 DIAGNOSIS — N3000 Acute cystitis without hematuria: Secondary | ICD-10-CM | POA: Diagnosis not present

## 2018-10-25 DIAGNOSIS — R350 Frequency of micturition: Secondary | ICD-10-CM | POA: Diagnosis not present

## 2018-10-25 DIAGNOSIS — R310 Gross hematuria: Secondary | ICD-10-CM | POA: Diagnosis not present

## 2018-10-25 DIAGNOSIS — N3946 Mixed incontinence: Secondary | ICD-10-CM | POA: Diagnosis not present

## 2018-10-25 DIAGNOSIS — R82998 Other abnormal findings in urine: Secondary | ICD-10-CM | POA: Diagnosis not present

## 2018-10-25 DIAGNOSIS — R300 Dysuria: Secondary | ICD-10-CM | POA: Diagnosis not present

## 2018-10-25 DIAGNOSIS — Z87440 Personal history of urinary (tract) infections: Secondary | ICD-10-CM | POA: Diagnosis not present

## 2018-11-02 DIAGNOSIS — R350 Frequency of micturition: Secondary | ICD-10-CM | POA: Diagnosis not present

## 2018-11-07 ENCOUNTER — Other Ambulatory Visit: Payer: Self-pay | Admitting: *Deleted

## 2018-11-07 DIAGNOSIS — Z20822 Contact with and (suspected) exposure to covid-19: Secondary | ICD-10-CM

## 2018-11-07 DIAGNOSIS — R6889 Other general symptoms and signs: Secondary | ICD-10-CM | POA: Diagnosis not present

## 2018-11-08 ENCOUNTER — Telehealth: Payer: Self-pay | Admitting: General Practice

## 2018-11-08 LAB — NOVEL CORONAVIRUS, NAA: SARS-CoV-2, NAA: NOT DETECTED

## 2018-11-08 LAB — SPECIMEN STATUS REPORT

## 2018-11-08 NOTE — Telephone Encounter (Signed)
Negative COVID results given. Patient results "NOT Detected." Caller expressed understanding. ° °

## 2018-11-14 ENCOUNTER — Other Ambulatory Visit: Payer: Self-pay

## 2018-11-14 DIAGNOSIS — Z20828 Contact with and (suspected) exposure to other viral communicable diseases: Secondary | ICD-10-CM | POA: Diagnosis not present

## 2018-11-14 DIAGNOSIS — Z20822 Contact with and (suspected) exposure to covid-19: Secondary | ICD-10-CM

## 2018-11-15 LAB — NOVEL CORONAVIRUS, NAA: SARS-CoV-2, NAA: NOT DETECTED

## 2018-11-21 ENCOUNTER — Other Ambulatory Visit: Payer: Self-pay

## 2018-11-21 DIAGNOSIS — Z20828 Contact with and (suspected) exposure to other viral communicable diseases: Secondary | ICD-10-CM | POA: Diagnosis not present

## 2018-11-21 DIAGNOSIS — Z20822 Contact with and (suspected) exposure to covid-19: Secondary | ICD-10-CM

## 2018-11-22 LAB — NOVEL CORONAVIRUS, NAA: SARS-CoV-2, NAA: NOT DETECTED

## 2018-11-22 NOTE — Telephone Encounter (Signed)
Received call from patient to check Covid results.  Advised no results at this time.

## 2018-12-19 DIAGNOSIS — H02204 Unspecified lagophthalmos left upper eyelid: Secondary | ICD-10-CM | POA: Diagnosis not present

## 2018-12-19 DIAGNOSIS — H16223 Keratoconjunctivitis sicca, not specified as Sjogren's, bilateral: Secondary | ICD-10-CM | POA: Diagnosis not present

## 2018-12-19 DIAGNOSIS — H02201 Unspecified lagophthalmos right upper eyelid: Secondary | ICD-10-CM | POA: Diagnosis not present

## 2018-12-19 DIAGNOSIS — H18513 Endothelial corneal dystrophy, bilateral: Secondary | ICD-10-CM | POA: Diagnosis not present

## 2018-12-20 DIAGNOSIS — R300 Dysuria: Secondary | ICD-10-CM | POA: Diagnosis not present

## 2019-01-02 DIAGNOSIS — Z23 Encounter for immunization: Secondary | ICD-10-CM | POA: Diagnosis not present

## 2019-01-18 DIAGNOSIS — I2510 Atherosclerotic heart disease of native coronary artery without angina pectoris: Secondary | ICD-10-CM | POA: Diagnosis not present

## 2019-01-18 DIAGNOSIS — R072 Precordial pain: Secondary | ICD-10-CM | POA: Diagnosis not present

## 2019-01-18 DIAGNOSIS — E7849 Other hyperlipidemia: Secondary | ICD-10-CM | POA: Diagnosis not present

## 2019-01-18 DIAGNOSIS — I10 Essential (primary) hypertension: Secondary | ICD-10-CM | POA: Diagnosis not present

## 2019-01-19 DIAGNOSIS — Z0000 Encounter for general adult medical examination without abnormal findings: Secondary | ICD-10-CM | POA: Diagnosis not present

## 2019-01-19 DIAGNOSIS — E785 Hyperlipidemia, unspecified: Secondary | ICD-10-CM | POA: Diagnosis not present

## 2019-01-19 DIAGNOSIS — Z789 Other specified health status: Secondary | ICD-10-CM | POA: Diagnosis not present

## 2019-01-20 DIAGNOSIS — R7301 Impaired fasting glucose: Secondary | ICD-10-CM | POA: Diagnosis not present

## 2019-01-20 DIAGNOSIS — G600 Hereditary motor and sensory neuropathy: Secondary | ICD-10-CM | POA: Diagnosis not present

## 2019-01-20 DIAGNOSIS — R3129 Other microscopic hematuria: Secondary | ICD-10-CM | POA: Diagnosis not present

## 2019-01-20 DIAGNOSIS — I10 Essential (primary) hypertension: Secondary | ICD-10-CM | POA: Diagnosis not present

## 2019-01-20 DIAGNOSIS — E785 Hyperlipidemia, unspecified: Secondary | ICD-10-CM | POA: Diagnosis not present

## 2019-01-20 DIAGNOSIS — K219 Gastro-esophageal reflux disease without esophagitis: Secondary | ICD-10-CM | POA: Diagnosis not present

## 2019-01-20 DIAGNOSIS — R319 Hematuria, unspecified: Secondary | ICD-10-CM | POA: Diagnosis not present

## 2019-01-20 DIAGNOSIS — Z0000 Encounter for general adult medical examination without abnormal findings: Secondary | ICD-10-CM | POA: Diagnosis not present

## 2019-01-20 DIAGNOSIS — M810 Age-related osteoporosis without current pathological fracture: Secondary | ICD-10-CM | POA: Diagnosis not present

## 2019-01-20 DIAGNOSIS — N390 Urinary tract infection, site not specified: Secondary | ICD-10-CM | POA: Diagnosis not present

## 2019-01-31 DIAGNOSIS — N390 Urinary tract infection, site not specified: Secondary | ICD-10-CM | POA: Diagnosis not present

## 2019-02-01 ENCOUNTER — Ambulatory Visit: Payer: PPO | Attending: Internal Medicine

## 2019-02-01 DIAGNOSIS — Z20822 Contact with and (suspected) exposure to covid-19: Secondary | ICD-10-CM

## 2019-02-02 ENCOUNTER — Ambulatory Visit: Payer: PPO | Attending: Internal Medicine

## 2019-02-02 ENCOUNTER — Telehealth: Payer: Self-pay | Admitting: *Deleted

## 2019-02-02 DIAGNOSIS — Z20822 Contact with and (suspected) exposure to covid-19: Secondary | ICD-10-CM

## 2019-02-02 DIAGNOSIS — Z20828 Contact with and (suspected) exposure to other viral communicable diseases: Secondary | ICD-10-CM | POA: Diagnosis not present

## 2019-02-02 LAB — NOVEL CORONAVIRUS, NAA

## 2019-02-02 NOTE — Telephone Encounter (Signed)
Call from [place] at [facility]: patient sample arrived without name on specimen- will need to be recollected. Call to patient- patient request appointment today- patient scheduled.

## 2019-02-04 LAB — NOVEL CORONAVIRUS, NAA: SARS-CoV-2, NAA: NOT DETECTED

## 2019-02-06 DIAGNOSIS — H16223 Keratoconjunctivitis sicca, not specified as Sjogren's, bilateral: Secondary | ICD-10-CM | POA: Diagnosis not present

## 2019-02-06 DIAGNOSIS — H5712 Ocular pain, left eye: Secondary | ICD-10-CM | POA: Diagnosis not present

## 2019-02-06 DIAGNOSIS — M316 Other giant cell arteritis: Secondary | ICD-10-CM | POA: Diagnosis not present

## 2019-02-06 DIAGNOSIS — M26622 Arthralgia of left temporomandibular joint: Secondary | ICD-10-CM | POA: Diagnosis not present

## 2019-02-06 DIAGNOSIS — Z961 Presence of intraocular lens: Secondary | ICD-10-CM | POA: Diagnosis not present

## 2019-02-06 DIAGNOSIS — H18513 Endothelial corneal dystrophy, bilateral: Secondary | ICD-10-CM | POA: Diagnosis not present

## 2019-02-24 DIAGNOSIS — N390 Urinary tract infection, site not specified: Secondary | ICD-10-CM | POA: Diagnosis not present

## 2019-03-06 DIAGNOSIS — L661 Lichen planopilaris: Secondary | ICD-10-CM | POA: Diagnosis not present

## 2019-03-13 DIAGNOSIS — R079 Chest pain, unspecified: Secondary | ICD-10-CM | POA: Diagnosis not present

## 2019-03-13 DIAGNOSIS — M810 Age-related osteoporosis without current pathological fracture: Secondary | ICD-10-CM | POA: Diagnosis not present

## 2019-03-14 DIAGNOSIS — R079 Chest pain, unspecified: Secondary | ICD-10-CM | POA: Diagnosis not present

## 2019-04-04 DIAGNOSIS — L661 Lichen planopilaris: Secondary | ICD-10-CM | POA: Diagnosis not present

## 2019-04-06 DIAGNOSIS — M810 Age-related osteoporosis without current pathological fracture: Secondary | ICD-10-CM | POA: Diagnosis not present

## 2019-04-06 DIAGNOSIS — Z780 Asymptomatic menopausal state: Secondary | ICD-10-CM | POA: Diagnosis not present

## 2019-04-06 DIAGNOSIS — Z1231 Encounter for screening mammogram for malignant neoplasm of breast: Secondary | ICD-10-CM | POA: Diagnosis not present

## 2019-05-03 DIAGNOSIS — N390 Urinary tract infection, site not specified: Secondary | ICD-10-CM | POA: Diagnosis not present

## 2019-05-10 DIAGNOSIS — R7301 Impaired fasting glucose: Secondary | ICD-10-CM | POA: Diagnosis not present

## 2019-05-10 DIAGNOSIS — E785 Hyperlipidemia, unspecified: Secondary | ICD-10-CM | POA: Diagnosis not present

## 2019-05-15 DIAGNOSIS — L661 Lichen planopilaris: Secondary | ICD-10-CM | POA: Diagnosis not present

## 2019-05-15 DIAGNOSIS — L439 Lichen planus, unspecified: Secondary | ICD-10-CM | POA: Diagnosis not present

## 2019-05-16 DIAGNOSIS — E785 Hyperlipidemia, unspecified: Secondary | ICD-10-CM | POA: Diagnosis not present

## 2019-05-16 DIAGNOSIS — R5381 Other malaise: Secondary | ICD-10-CM | POA: Diagnosis not present

## 2019-05-16 DIAGNOSIS — R0789 Other chest pain: Secondary | ICD-10-CM | POA: Diagnosis not present

## 2019-05-16 DIAGNOSIS — I2510 Atherosclerotic heart disease of native coronary artery without angina pectoris: Secondary | ICD-10-CM | POA: Diagnosis not present

## 2019-05-16 DIAGNOSIS — I10 Essential (primary) hypertension: Secondary | ICD-10-CM | POA: Diagnosis not present

## 2019-05-16 DIAGNOSIS — R5383 Other fatigue: Secondary | ICD-10-CM | POA: Diagnosis not present

## 2019-05-18 DIAGNOSIS — I10 Essential (primary) hypertension: Secondary | ICD-10-CM | POA: Diagnosis not present

## 2019-05-18 DIAGNOSIS — R7301 Impaired fasting glucose: Secondary | ICD-10-CM | POA: Diagnosis not present

## 2019-05-18 DIAGNOSIS — E785 Hyperlipidemia, unspecified: Secondary | ICD-10-CM | POA: Diagnosis not present

## 2019-05-22 DIAGNOSIS — I10 Essential (primary) hypertension: Secondary | ICD-10-CM | POA: Diagnosis not present

## 2019-05-22 DIAGNOSIS — R5381 Other malaise: Secondary | ICD-10-CM | POA: Diagnosis not present

## 2019-05-22 DIAGNOSIS — Z01812 Encounter for preprocedural laboratory examination: Secondary | ICD-10-CM | POA: Diagnosis not present

## 2019-05-22 DIAGNOSIS — Z20822 Contact with and (suspected) exposure to covid-19: Secondary | ICD-10-CM | POA: Diagnosis not present

## 2019-05-23 DIAGNOSIS — N390 Urinary tract infection, site not specified: Secondary | ICD-10-CM | POA: Diagnosis not present

## 2019-05-30 DIAGNOSIS — G600 Hereditary motor and sensory neuropathy: Secondary | ICD-10-CM | POA: Diagnosis not present

## 2019-06-09 DIAGNOSIS — N390 Urinary tract infection, site not specified: Secondary | ICD-10-CM | POA: Diagnosis not present

## 2019-06-13 DIAGNOSIS — R5383 Other fatigue: Secondary | ICD-10-CM | POA: Diagnosis not present

## 2019-06-13 DIAGNOSIS — I10 Essential (primary) hypertension: Secondary | ICD-10-CM | POA: Diagnosis not present

## 2019-06-13 DIAGNOSIS — R5381 Other malaise: Secondary | ICD-10-CM | POA: Diagnosis not present

## 2019-06-20 DIAGNOSIS — R5382 Chronic fatigue, unspecified: Secondary | ICD-10-CM | POA: Diagnosis not present

## 2019-06-20 DIAGNOSIS — G600 Hereditary motor and sensory neuropathy: Secondary | ICD-10-CM | POA: Diagnosis not present

## 2019-06-22 DIAGNOSIS — H02204 Unspecified lagophthalmos left upper eyelid: Secondary | ICD-10-CM | POA: Diagnosis not present

## 2019-06-22 DIAGNOSIS — Z961 Presence of intraocular lens: Secondary | ICD-10-CM | POA: Diagnosis not present

## 2019-06-22 DIAGNOSIS — H18513 Endothelial corneal dystrophy, bilateral: Secondary | ICD-10-CM | POA: Diagnosis not present

## 2019-06-22 DIAGNOSIS — H16223 Keratoconjunctivitis sicca, not specified as Sjogren's, bilateral: Secondary | ICD-10-CM | POA: Diagnosis not present

## 2019-06-22 DIAGNOSIS — H02201 Unspecified lagophthalmos right upper eyelid: Secondary | ICD-10-CM | POA: Diagnosis not present

## 2019-06-26 DIAGNOSIS — L821 Other seborrheic keratosis: Secondary | ICD-10-CM | POA: Diagnosis not present

## 2019-06-26 DIAGNOSIS — D1801 Hemangioma of skin and subcutaneous tissue: Secondary | ICD-10-CM | POA: Diagnosis not present

## 2019-06-26 DIAGNOSIS — L570 Actinic keratosis: Secondary | ICD-10-CM | POA: Diagnosis not present

## 2019-06-26 DIAGNOSIS — L905 Scar conditions and fibrosis of skin: Secondary | ICD-10-CM | POA: Diagnosis not present

## 2019-06-26 DIAGNOSIS — L661 Lichen planopilaris: Secondary | ICD-10-CM | POA: Diagnosis not present

## 2019-06-26 DIAGNOSIS — L578 Other skin changes due to chronic exposure to nonionizing radiation: Secondary | ICD-10-CM | POA: Diagnosis not present

## 2019-06-27 DIAGNOSIS — N390 Urinary tract infection, site not specified: Secondary | ICD-10-CM | POA: Diagnosis not present

## 2019-07-05 DIAGNOSIS — K640 First degree hemorrhoids: Secondary | ICD-10-CM | POA: Diagnosis not present

## 2019-07-05 DIAGNOSIS — K621 Rectal polyp: Secondary | ICD-10-CM | POA: Diagnosis not present

## 2019-07-05 DIAGNOSIS — K635 Polyp of colon: Secondary | ICD-10-CM | POA: Diagnosis not present

## 2019-07-05 DIAGNOSIS — K5730 Diverticulosis of large intestine without perforation or abscess without bleeding: Secondary | ICD-10-CM | POA: Diagnosis not present

## 2019-07-05 DIAGNOSIS — Z86010 Personal history of colonic polyps: Secondary | ICD-10-CM | POA: Diagnosis not present

## 2019-07-05 DIAGNOSIS — D128 Benign neoplasm of rectum: Secondary | ICD-10-CM | POA: Diagnosis not present

## 2019-07-05 DIAGNOSIS — Z1211 Encounter for screening for malignant neoplasm of colon: Secondary | ICD-10-CM | POA: Diagnosis not present

## 2019-07-12 DIAGNOSIS — N390 Urinary tract infection, site not specified: Secondary | ICD-10-CM | POA: Diagnosis not present

## 2019-08-02 DIAGNOSIS — L669 Cicatricial alopecia, unspecified: Secondary | ICD-10-CM | POA: Diagnosis not present

## 2019-08-10 DIAGNOSIS — L669 Cicatricial alopecia, unspecified: Secondary | ICD-10-CM | POA: Diagnosis not present

## 2019-08-31 DIAGNOSIS — M19072 Primary osteoarthritis, left ankle and foot: Secondary | ICD-10-CM | POA: Diagnosis not present

## 2019-08-31 DIAGNOSIS — S9032XA Contusion of left foot, initial encounter: Secondary | ICD-10-CM | POA: Diagnosis not present

## 2019-08-31 DIAGNOSIS — L602 Onychogryphosis: Secondary | ICD-10-CM | POA: Diagnosis not present

## 2019-08-31 DIAGNOSIS — L600 Ingrowing nail: Secondary | ICD-10-CM | POA: Diagnosis not present

## 2019-09-07 DIAGNOSIS — M2042 Other hammer toe(s) (acquired), left foot: Secondary | ICD-10-CM | POA: Diagnosis not present

## 2019-09-07 DIAGNOSIS — M2041 Other hammer toe(s) (acquired), right foot: Secondary | ICD-10-CM | POA: Diagnosis not present

## 2019-09-07 DIAGNOSIS — L98491 Non-pressure chronic ulcer of skin of other sites limited to breakdown of skin: Secondary | ICD-10-CM | POA: Diagnosis not present

## 2019-09-10 ENCOUNTER — Emergency Department (HOSPITAL_BASED_OUTPATIENT_CLINIC_OR_DEPARTMENT_OTHER)
Admission: EM | Admit: 2019-09-10 | Discharge: 2019-09-10 | Disposition: A | Discharge status: Home / Self Care | Payer: PPO | Attending: Emergency Medicine | Admitting: Emergency Medicine

## 2019-09-10 ENCOUNTER — Encounter (HOSPITAL_BASED_OUTPATIENT_CLINIC_OR_DEPARTMENT_OTHER): Payer: Self-pay | Admitting: Emergency Medicine

## 2019-09-10 ENCOUNTER — Other Ambulatory Visit: Payer: Self-pay

## 2019-09-10 DIAGNOSIS — I2510 Atherosclerotic heart disease of native coronary artery without angina pectoris: Secondary | ICD-10-CM | POA: Diagnosis not present

## 2019-09-10 DIAGNOSIS — I10 Essential (primary) hypertension: Secondary | ICD-10-CM | POA: Insufficient documentation

## 2019-09-10 DIAGNOSIS — M79672 Pain in left foot: Secondary | ICD-10-CM | POA: Diagnosis not present

## 2019-09-10 DIAGNOSIS — Z7982 Long term (current) use of aspirin: Secondary | ICD-10-CM | POA: Insufficient documentation

## 2019-09-10 DIAGNOSIS — R2242 Localized swelling, mass and lump, left lower limb: Secondary | ICD-10-CM | POA: Insufficient documentation

## 2019-09-10 MED ORDER — DOXYCYCLINE HYCLATE 100 MG PO TABS
100.0000 mg | ORAL_TABLET | Freq: Once | ORAL | Status: AC
  Administered 2019-09-10: 100 mg via ORAL
  Filled 2019-09-10: qty 1

## 2019-09-10 MED ORDER — DOXYCYCLINE MONOHYDRATE 100 MG PO TABS: 100.0000 mg | ORAL_TABLET | Freq: Two times a day (BID) | ORAL | 0 refills | Status: AC

## 2019-09-10 NOTE — ED Provider Notes (Signed)
[facility] EMERGENCY DEPARTMENT Provider Note   CSN: [id number] Arrival date & time: 09/10/19  1029     History Chief Complaint  Patient presents with  . Foot Pain    Nina Rush is a 79 y.o. female.  Patient recently had tendon release by Podiatrist 4 days ago.  Was able to weight bear after surgery.  She reports that this morning she noticed more redness and swelling of the left foot as well as bruising at the site of surgery. Endorses that redness has slightly improved now.  Also endorses pain and warmth.  She has a follow up appointment with her Podiatrist tomorrow.  The history is provided by the patient and the spouse.  Foot Pain This is a new problem. The current episode started more than 2 days ago. The problem occurs constantly. The problem has been gradually improving. Pertinent negatives include no chest pain and no shortness of breath. Nothing aggravates the symptoms. The symptoms are relieved by acetaminophen. She has tried acetaminophen for the symptoms. The treatment provided mild relief.       Past Medical History:  Diagnosis Date  . Charcot-Marie-Tooth disease   . Fuchs' adenoma    bilateral eyes  . Hypertension   . Lichen planus    scalp    Patient Active Problem List   Diagnosis Date Noted  . ACS (acute coronary syndrome) ([place]) 07/11/2018  . Chest pain 07/10/2018  . Essential hypertension 07/10/2018    Past Surgical History:  Procedure Laterality Date  . ABDOMINAL HYSTERECTOMY     total  . APPENDECTOMY    . CATARACT EXTRACTION Bilateral   . CHOLECYSTECTOMY    . LEFT HEART CATH AND CORONARY ANGIOGRAPHY N/A 07/11/2018   Procedure: LEFT HEART CATH AND CORONARY ANGIOGRAPHY;  Surgeon: Dixie Dials, MD;  Location: [facility];  Service: Cardiovascular;  Laterality: N/A;  . LEG SURGERY     as child     OB History   No obstetric history on file.     Family History  Problem Relation Age of Onset  . Cancer - Lung Mother   . Stroke  Father     Social History   Tobacco Use  . Smoking status: Never Smoker  . Smokeless tobacco: Never Used  Vaping Use  . Vaping Use: Never used  Substance Use Topics  . Alcohol use: No  . Drug use: No    Home Medications Prior to Admission medications   Medication Sig Start Date End Date Taking? Authorizing Provider  amLODipine (NORVASC) 2.5 MG tablet Take 1 tablet (2.5 mg total) by mouth daily. 07/12/18   Radene Gunning, NP  aspirin EC 81 MG tablet Take 81 mg by mouth See admin instructions. Take one tablet (81 mg) by mouth every other night    [provider]  atorvastatin (LIPITOR) 20 MG tablet Take 1 tablet (20 mg total) by mouth daily at 6 PM. 07/12/18   Black, Lezlie Octave, NP  clobetasol (TEMOVATE) 0.05 % external solution Apply 1 application topically daily as needed (scalp itching).  04/27/13   [provider]  cycloSPORINE (RESTASIS) 0.05 % ophthalmic emulsion Place 1 drop into both eyes 2 (two) times daily.    [provider]  doxycycline (ADOXA) 100 MG tablet Take 1 tablet (100 mg total) by mouth 2 (two) times daily for 5 days. 09/10/19 09/15/19  Carollee Leitz, MD  doxycycline (VIBRAMYCIN) 50 MG capsule Take 50 mg by mouth at bedtime.  07/06/18  [provider]  estradiol (ESTRACE) 0.1 MG/GM vaginal cream Place vaginally See admin instructions. Apply pea size amount vaginally every other night    [provider]  furosemide (LASIX) 20 MG tablet Take 40 mg by mouth every other day.  10/18/15   [provider]  lisinopril (PRINIVIL,ZESTRIL) 10 MG tablet Take 10 mg by mouth daily.    [provider]  mirabegron ER (MYRBETRIQ) 50 MG TB24 tablet Take 50 mg by mouth at bedtime.     [provider]  Multiple Vitamin (MULTIVITAMIN WITH MINERALS) TABS tablet Take 1 tablet by mouth daily.    [provider]  nitrofurantoin (MACRODANTIN) 100 MG capsule Take 100 mg by mouth daily.    [provider]  Omega-3 Fatty  Acids (OMEGA 3 PO) Take 1 capsule by mouth daily.    [provider]  OVER THE COUNTER MEDICATION Place 1 application into both eyes at bedtime. Over the counter eye ointment    [provider]  OVER THE COUNTER MEDICATION Place 1 drop into both eyes 4 (four) times daily as needed (dry eyes). Over the counter lubricating eye drops    [provider]  oxybutynin (DITROPAN) 5 MG tablet Take 5 mg by mouth daily. 05/12/18   [provider]  potassium chloride (K-DUR) 10 MEQ tablet Take 20 mEq by mouth every other day.     [provider]    Allergies    Ciprofloxacin, Penicillins, and Sulfa antibiotics  Review of Systems   Review of Systems  Constitutional: Negative for chills, diaphoresis and fever.  Respiratory: Negative for shortness of breath.   Cardiovascular: Negative for chest pain and leg swelling.    Physical Exam Updated Vital Signs BP (!) 131/70 (BP Location: Right Arm)   Pulse 85   Temp 98.8 F (37.1 C) (Oral)   Resp 18   Ht 5\' 3"  (1.6 m)   Wt 45.1 kg   SpO2 98%   BMI 17.63 kg/m   Physical Exam Constitutional:      Appearance: Normal appearance.  Cardiovascular:     Rate and Rhythm: Normal rate and regular rhythm.  Pulmonary:     Effort: Pulmonary effort is normal.  Musculoskeletal:        General: Swelling and tenderness present.  Skin:    General: Skin is warm.     Findings: Erythema present.  Neurological:     Mental Status: She is alert and oriented to person, place, and time.     ED Results / Procedures / Treatments   Labs (all labs ordered are listed, but only abnormal results are displayed) Labs Reviewed - No data to display  EKG None  Radiology No results found.   Medications Ordered in ED Medications  doxycycline (VIBRA-TABS) tablet 100 mg (100 mg Oral Given 09/10/19 1154)    ED Course  I have reviewed the triage vital signs and the nursing notes.  Pertinent labs & imaging results that were  available during my care of the patient were reviewed by me and considered in my medical decision making (see chart for details).  Soft tissue inflammation of left foot likely secondary to to recent procedure.  Will treat empirically with doxycyline for possible underlying infection.   MDM Rules/Calculators/A&P                           Final Clinical Impression(s) / ED Diagnoses Final diagnoses:  Foot pain, left  Rx / DC Orders ED Discharge Orders         Ordered    doxycycline (ADOXA) 100 MG tablet  2 times daily     Discontinue  Reprint     09/10/19 1151           Carollee Leitz, MD 09/10/19 1222    Blanchie Dessert, MD 09/10/19 [phone]

## 2019-09-10 NOTE — Discharge Instructions (Signed)
Please follow up with Podiatry tomorrow.    If worsening symptoms return to urgent care.

## 2019-09-10 NOTE — ED Triage Notes (Signed)
Pt c/o left foot pain and possible infection. Pt had tendon surgery on Thursday.

## 2019-09-11 DIAGNOSIS — R7301 Impaired fasting glucose: Secondary | ICD-10-CM | POA: Diagnosis not present

## 2019-09-11 DIAGNOSIS — M2041 Other hammer toe(s) (acquired), right foot: Secondary | ICD-10-CM | POA: Diagnosis not present

## 2019-09-11 DIAGNOSIS — Z4789 Encounter for other orthopedic aftercare: Secondary | ICD-10-CM | POA: Diagnosis not present

## 2019-09-11 DIAGNOSIS — M2042 Other hammer toe(s) (acquired), left foot: Secondary | ICD-10-CM | POA: Diagnosis not present

## 2019-09-11 DIAGNOSIS — E785 Hyperlipidemia, unspecified: Secondary | ICD-10-CM | POA: Diagnosis not present

## 2019-09-11 DIAGNOSIS — L98491 Non-pressure chronic ulcer of skin of other sites limited to breakdown of skin: Secondary | ICD-10-CM | POA: Diagnosis not present

## 2019-09-18 DIAGNOSIS — R7301 Impaired fasting glucose: Secondary | ICD-10-CM | POA: Diagnosis not present

## 2019-09-18 DIAGNOSIS — M810 Age-related osteoporosis without current pathological fracture: Secondary | ICD-10-CM | POA: Diagnosis not present

## 2019-09-18 DIAGNOSIS — E785 Hyperlipidemia, unspecified: Secondary | ICD-10-CM | POA: Diagnosis not present

## 2019-09-18 DIAGNOSIS — I10 Essential (primary) hypertension: Secondary | ICD-10-CM | POA: Diagnosis not present

## 2019-09-20 DIAGNOSIS — M2041 Other hammer toe(s) (acquired), right foot: Secondary | ICD-10-CM | POA: Diagnosis not present

## 2019-09-20 DIAGNOSIS — L98491 Non-pressure chronic ulcer of skin of other sites limited to breakdown of skin: Secondary | ICD-10-CM | POA: Diagnosis not present

## 2019-09-20 DIAGNOSIS — M2042 Other hammer toe(s) (acquired), left foot: Secondary | ICD-10-CM | POA: Diagnosis not present

## 2019-10-02 DIAGNOSIS — R21 Rash and other nonspecific skin eruption: Secondary | ICD-10-CM | POA: Diagnosis not present

## 2019-10-02 DIAGNOSIS — L661 Lichen planopilaris: Secondary | ICD-10-CM | POA: Diagnosis not present

## 2019-10-26 DIAGNOSIS — E785 Hyperlipidemia, unspecified: Secondary | ICD-10-CM | POA: Diagnosis not present

## 2019-10-26 DIAGNOSIS — I10 Essential (primary) hypertension: Secondary | ICD-10-CM | POA: Diagnosis not present

## 2019-10-26 DIAGNOSIS — I2510 Atherosclerotic heart disease of native coronary artery without angina pectoris: Secondary | ICD-10-CM | POA: Diagnosis not present

## 2019-10-30 DIAGNOSIS — L661 Lichen planopilaris: Secondary | ICD-10-CM | POA: Diagnosis not present

## 2019-11-10 DIAGNOSIS — M25512 Pain in left shoulder: Secondary | ICD-10-CM | POA: Diagnosis not present

## 2019-11-10 DIAGNOSIS — M19012 Primary osteoarthritis, left shoulder: Secondary | ICD-10-CM | POA: Diagnosis not present

## 2019-11-10 DIAGNOSIS — M25551 Pain in right hip: Secondary | ICD-10-CM | POA: Diagnosis not present

## 2019-11-10 DIAGNOSIS — M1611 Unilateral primary osteoarthritis, right hip: Secondary | ICD-10-CM | POA: Diagnosis not present

## 2019-11-21 DIAGNOSIS — M5137 Other intervertebral disc degeneration, lumbosacral region: Secondary | ICD-10-CM | POA: Diagnosis not present

## 2019-11-21 DIAGNOSIS — M4186 Other forms of scoliosis, lumbar region: Secondary | ICD-10-CM | POA: Diagnosis not present

## 2019-11-21 DIAGNOSIS — M47816 Spondylosis without myelopathy or radiculopathy, lumbar region: Secondary | ICD-10-CM | POA: Diagnosis not present

## 2019-11-21 DIAGNOSIS — M5136 Other intervertebral disc degeneration, lumbar region: Secondary | ICD-10-CM | POA: Diagnosis not present

## 2019-12-01 DIAGNOSIS — G600 Hereditary motor and sensory neuropathy: Secondary | ICD-10-CM | POA: Diagnosis not present

## 2019-12-01 DIAGNOSIS — R531 Weakness: Secondary | ICD-10-CM | POA: Diagnosis not present

## 2019-12-01 DIAGNOSIS — M48061 Spinal stenosis, lumbar region without neurogenic claudication: Secondary | ICD-10-CM | POA: Diagnosis not present

## 2019-12-02 DIAGNOSIS — Z23 Encounter for immunization: Secondary | ICD-10-CM | POA: Diagnosis not present

## 2019-12-11 DIAGNOSIS — M48061 Spinal stenosis, lumbar region without neurogenic claudication: Secondary | ICD-10-CM | POA: Diagnosis not present

## 2019-12-11 DIAGNOSIS — R202 Paresthesia of skin: Secondary | ICD-10-CM | POA: Diagnosis not present

## 2019-12-11 DIAGNOSIS — M5136 Other intervertebral disc degeneration, lumbar region: Secondary | ICD-10-CM | POA: Diagnosis not present

## 2019-12-11 DIAGNOSIS — R531 Weakness: Secondary | ICD-10-CM | POA: Diagnosis not present

## 2019-12-11 DIAGNOSIS — M25512 Pain in left shoulder: Secondary | ICD-10-CM | POA: Diagnosis not present

## 2019-12-11 DIAGNOSIS — R200 Anesthesia of skin: Secondary | ICD-10-CM | POA: Diagnosis not present

## 2019-12-14 DIAGNOSIS — H02201 Unspecified lagophthalmos right upper eyelid: Secondary | ICD-10-CM | POA: Diagnosis not present

## 2019-12-14 DIAGNOSIS — H16223 Keratoconjunctivitis sicca, not specified as Sjogren's, bilateral: Secondary | ICD-10-CM | POA: Diagnosis not present

## 2019-12-14 DIAGNOSIS — Z961 Presence of intraocular lens: Secondary | ICD-10-CM | POA: Diagnosis not present

## 2019-12-14 DIAGNOSIS — H18513 Endothelial corneal dystrophy, bilateral: Secondary | ICD-10-CM | POA: Diagnosis not present

## 2019-12-14 DIAGNOSIS — H02204 Unspecified lagophthalmos left upper eyelid: Secondary | ICD-10-CM | POA: Diagnosis not present

## 2019-12-19 DIAGNOSIS — G2581 Restless legs syndrome: Secondary | ICD-10-CM | POA: Diagnosis not present

## 2019-12-19 DIAGNOSIS — G600 Hereditary motor and sensory neuropathy: Secondary | ICD-10-CM | POA: Diagnosis not present

## 2019-12-19 DIAGNOSIS — Z79899 Other long term (current) drug therapy: Secondary | ICD-10-CM | POA: Diagnosis not present

## 2020-01-08 DIAGNOSIS — L661 Lichen planopilaris: Secondary | ICD-10-CM | POA: Diagnosis not present

## 2020-01-08 DIAGNOSIS — B351 Tinea unguium: Secondary | ICD-10-CM | POA: Diagnosis not present

## 2020-01-11 IMAGING — CT CT OF THE LEFT FOOT WITHOUT CONTRAST
3 series · 10 of 35 positions shown, 12 images · non-contrast
Comparison: Radiographs dated 08/06/2018

CLINICAL DATA: Dorsal left foot pain, swelling, and erythema. Crush
injury when a shower head fell on the dorsum of her foot 1 week ago.

EXAM:
CT OF THE LEFT FOOT WITHOUT CONTRAST
TECHNIQUE: Multidetector CT imaging of the left foot was performed according to
the standard protocol. Multiplanar CT image reconstructions were
also generated.

[Series 6: axial st · coronal · 0.33mm/px · 3 of 283 slices shown]
[im 109/283  bone]
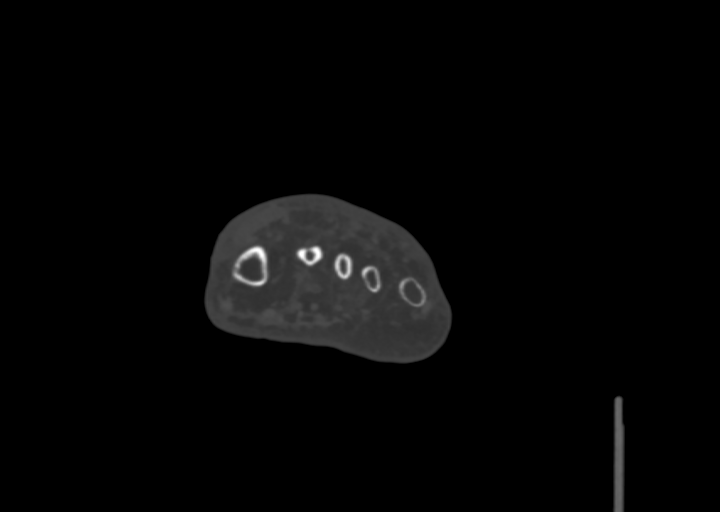
[im 131/283  bone]
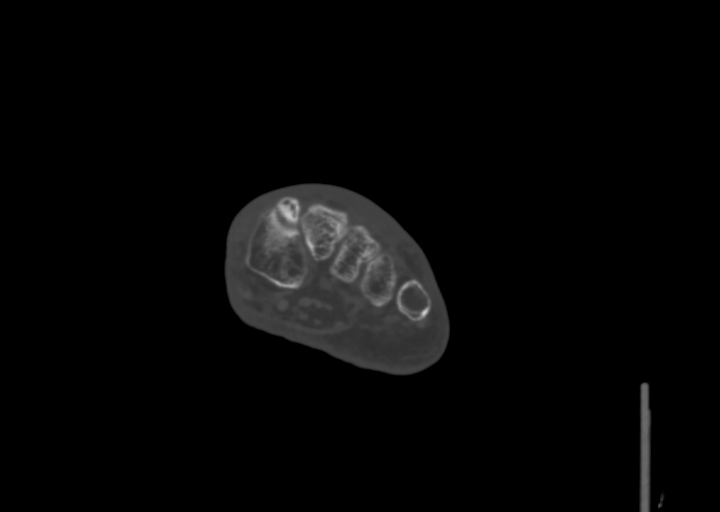
[im 153/283  bone]
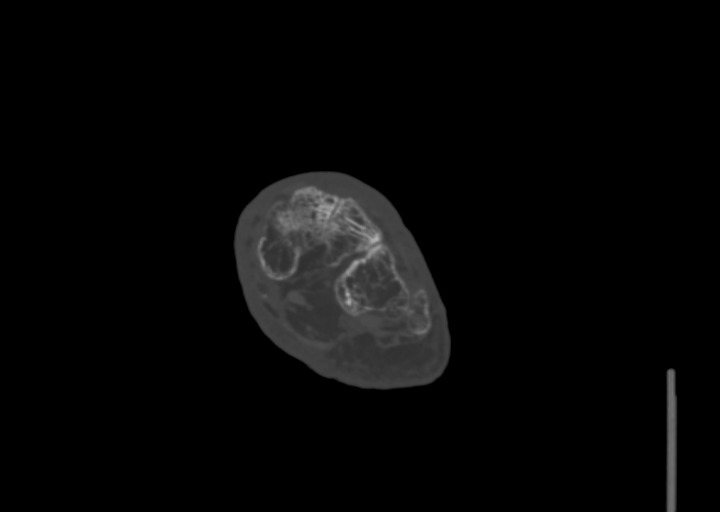

[Series 8: cor st · axial · 0.47mm/px · z∈[-228,-181]mm · 2 of 97 slices shown, 3 images]
[im 30/97  soft-tissue]
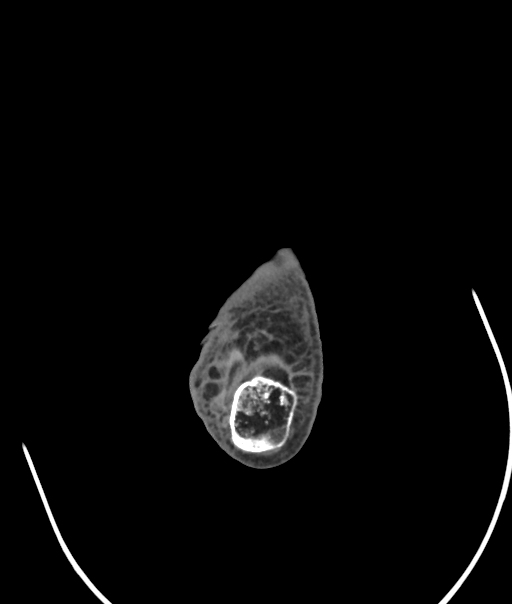
[im 30/97  bone]
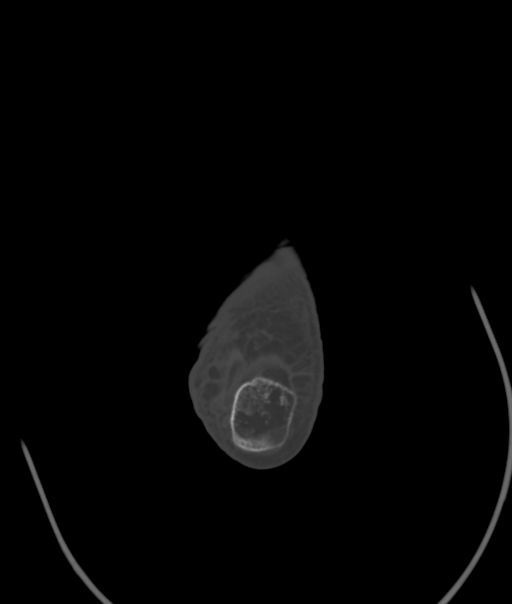
[im 74/97  bone]
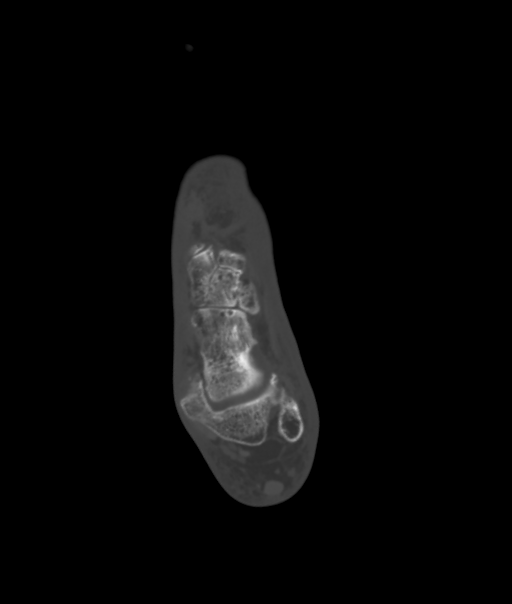

[Series 10: sag st · sagittal · 0.26mm/px · 5 of 99 slices shown, 6 images]
[im 33/99  bone]
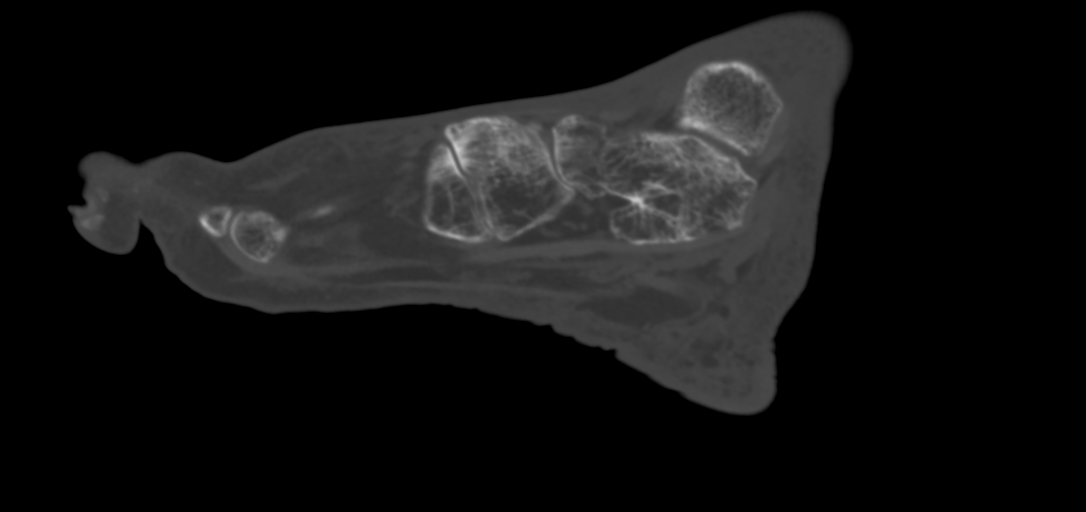
[im 41/99  bone]
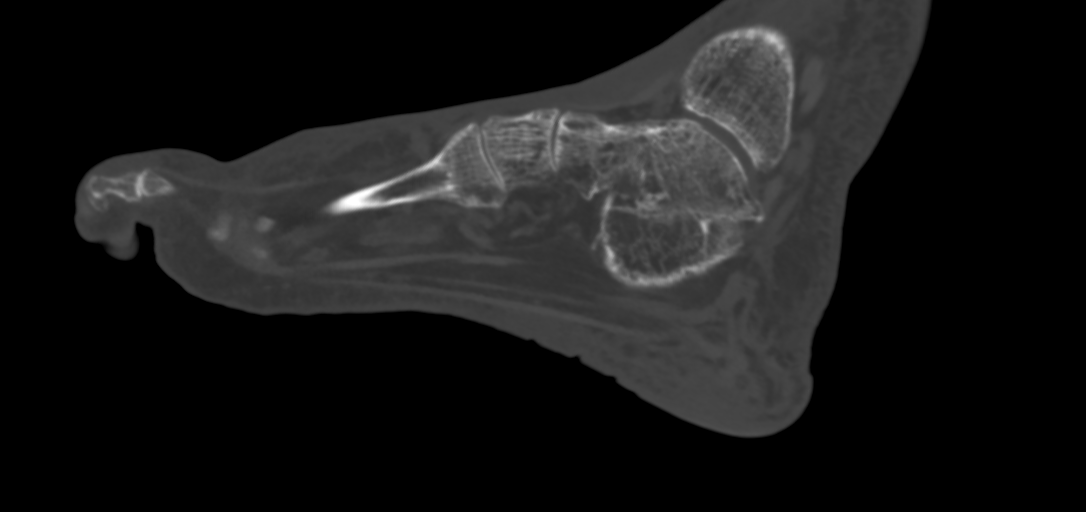
[im 50/99  soft-tissue]
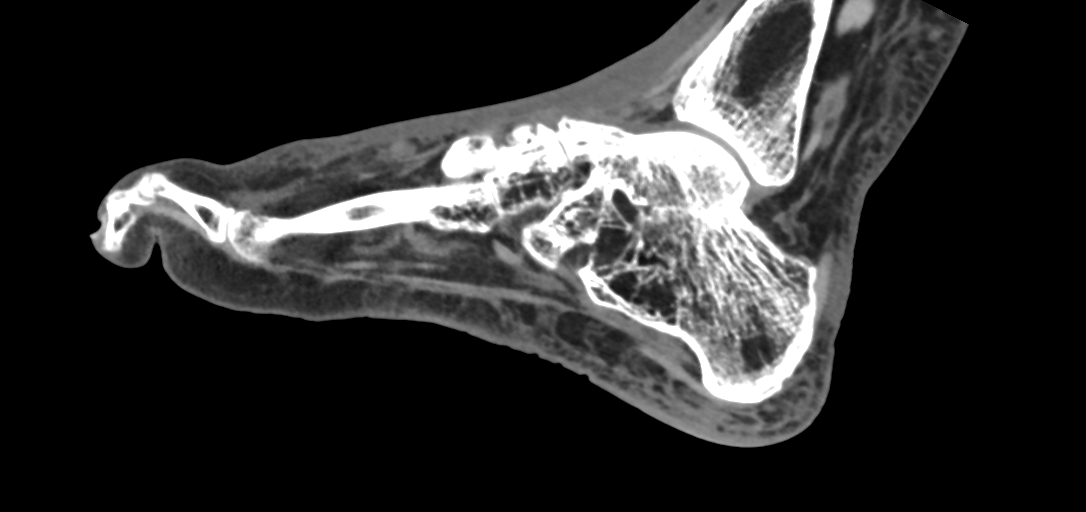
[im 50/99  bone]
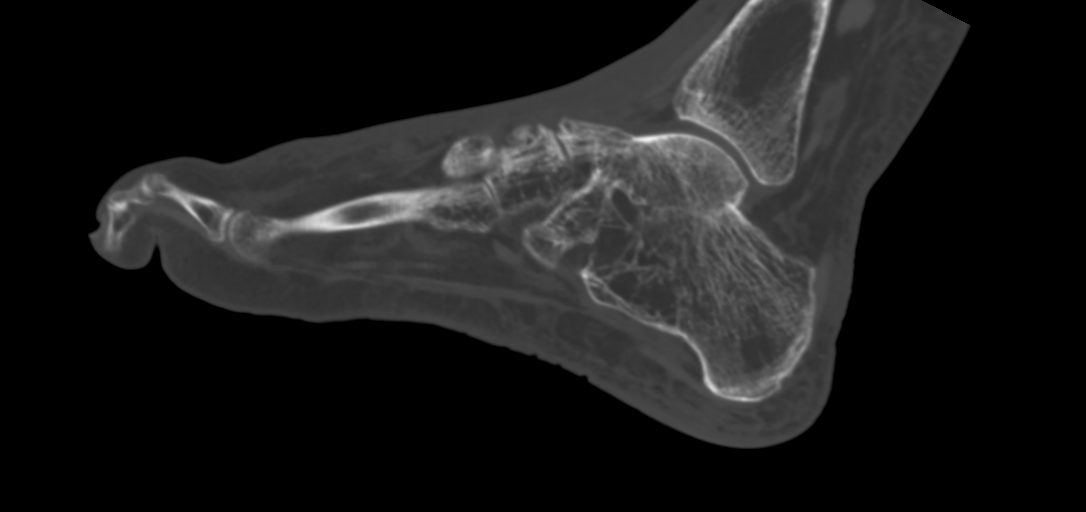
[im 58/99  bone]
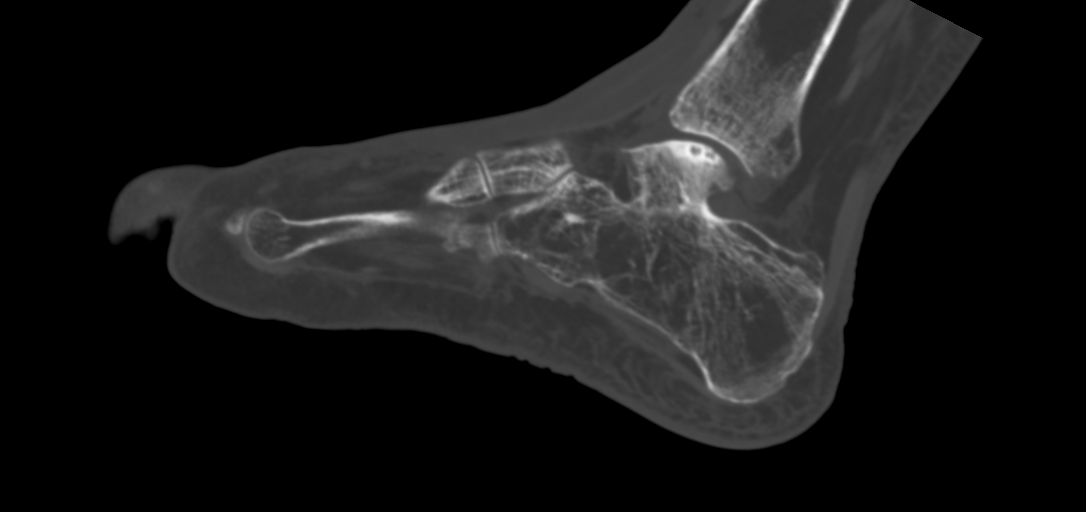
[im 66/99  bone]
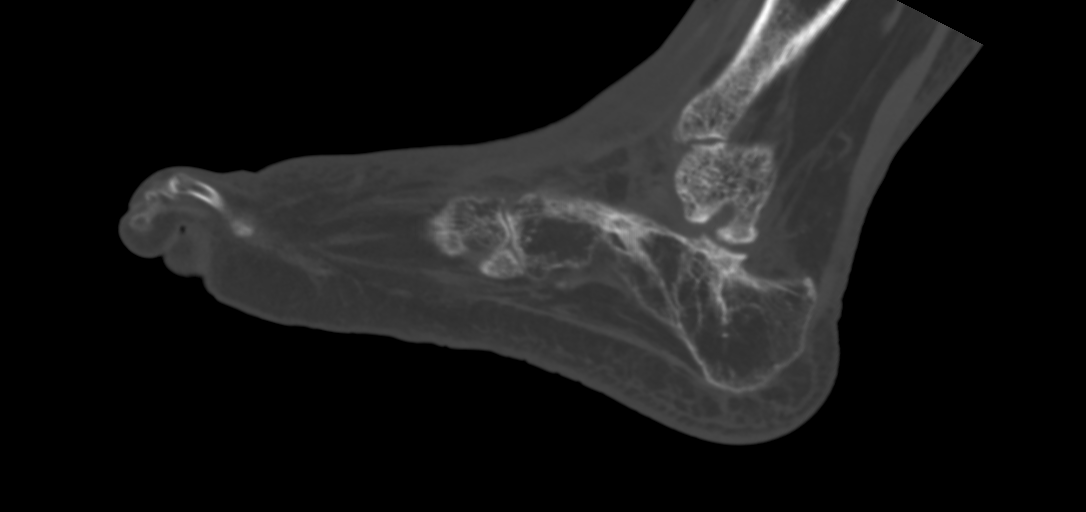

[10 of 35 positions shown; findings below may reference images not displayed]

FINDINGS: Bones/Joint/Cartilage

There is no fracture or dislocation. Previous fusion of the talus
with the calcaneus and cuboid as a child. Diffuse osteopenia.
Previous bunion surgery.

Ligaments

Suboptimally assessed by CT. The ligaments of the lateral aspect of
the ankle are intact. The deltoid ligament is not well seen.

Muscles and Tendons

The peroneal tendons are not present, probably due to prior surgery.
The Achilles tendon and medial and anterior tendons are intact.
Plantar fascia appears normal.

Soft tissues

Slight edema in the soft tissues of the dorsum of the foot and at
the anterior aspect of the ankle, nonspecific.
IMPRESSION: Soft tissue edema on the dorsum of the foot and at the anterior
aspect of the ankle. No acute bone abnormality. No definable
abscess.

## 2020-02-21 DIAGNOSIS — Z0000 Encounter for general adult medical examination without abnormal findings: Secondary | ICD-10-CM | POA: Diagnosis not present

## 2020-02-21 DIAGNOSIS — Z13220 Encounter for screening for lipoid disorders: Secondary | ICD-10-CM | POA: Diagnosis not present

## 2020-02-21 DIAGNOSIS — Z1329 Encounter for screening for other suspected endocrine disorder: Secondary | ICD-10-CM | POA: Diagnosis not present

## 2020-02-21 DIAGNOSIS — Z79899 Other long term (current) drug therapy: Secondary | ICD-10-CM | POA: Diagnosis not present

## 2020-02-21 DIAGNOSIS — Z13228 Encounter for screening for other metabolic disorders: Secondary | ICD-10-CM | POA: Diagnosis not present

## 2020-02-21 DIAGNOSIS — Z1389 Encounter for screening for other disorder: Secondary | ICD-10-CM | POA: Diagnosis not present

## 2020-02-21 DIAGNOSIS — Z130 Encounter for screening for diseases of the blood and blood-forming organs and certain disorders involving the immune mechanism: Secondary | ICD-10-CM | POA: Diagnosis not present

## 2020-02-29 DIAGNOSIS — K219 Gastro-esophageal reflux disease without esophagitis: Secondary | ICD-10-CM | POA: Diagnosis not present

## 2020-02-29 DIAGNOSIS — E785 Hyperlipidemia, unspecified: Secondary | ICD-10-CM | POA: Diagnosis not present

## 2020-02-29 DIAGNOSIS — R3129 Other microscopic hematuria: Secondary | ICD-10-CM | POA: Diagnosis not present

## 2020-02-29 DIAGNOSIS — I10 Essential (primary) hypertension: Secondary | ICD-10-CM | POA: Diagnosis not present

## 2020-02-29 DIAGNOSIS — Z0000 Encounter for general adult medical examination without abnormal findings: Secondary | ICD-10-CM | POA: Diagnosis not present

## 2020-02-29 DIAGNOSIS — R7301 Impaired fasting glucose: Secondary | ICD-10-CM | POA: Diagnosis not present

## 2020-02-29 DIAGNOSIS — I2510 Atherosclerotic heart disease of native coronary artery without angina pectoris: Secondary | ICD-10-CM | POA: Diagnosis not present

## 2020-02-29 DIAGNOSIS — M810 Age-related osteoporosis without current pathological fracture: Secondary | ICD-10-CM | POA: Diagnosis not present

## 2020-03-18 DIAGNOSIS — R233 Spontaneous ecchymoses: Secondary | ICD-10-CM | POA: Diagnosis not present

## 2020-03-18 DIAGNOSIS — M7989 Other specified soft tissue disorders: Secondary | ICD-10-CM | POA: Diagnosis not present

## 2020-03-19 DIAGNOSIS — R233 Spontaneous ecchymoses: Secondary | ICD-10-CM | POA: Diagnosis not present

## 2020-03-19 DIAGNOSIS — H61891 Other specified disorders of right external ear: Secondary | ICD-10-CM | POA: Diagnosis not present

## 2020-03-19 DIAGNOSIS — M7989 Other specified soft tissue disorders: Secondary | ICD-10-CM | POA: Diagnosis not present

## 2020-03-21 DIAGNOSIS — H02201 Unspecified lagophthalmos right upper eyelid: Secondary | ICD-10-CM | POA: Diagnosis not present

## 2020-03-21 DIAGNOSIS — H16223 Keratoconjunctivitis sicca, not specified as Sjogren's, bilateral: Secondary | ICD-10-CM | POA: Diagnosis not present

## 2020-03-21 DIAGNOSIS — H18513 Endothelial corneal dystrophy, bilateral: Secondary | ICD-10-CM | POA: Diagnosis not present

## 2020-03-21 DIAGNOSIS — H02204 Unspecified lagophthalmos left upper eyelid: Secondary | ICD-10-CM | POA: Diagnosis not present

## 2020-03-21 DIAGNOSIS — Z961 Presence of intraocular lens: Secondary | ICD-10-CM | POA: Diagnosis not present

## 2020-03-25 DIAGNOSIS — R300 Dysuria: Secondary | ICD-10-CM | POA: Diagnosis not present

## 2020-03-25 DIAGNOSIS — R8290 Unspecified abnormal findings in urine: Secondary | ICD-10-CM | POA: Diagnosis not present

## 2020-04-01 DIAGNOSIS — L661 Lichen planopilaris: Secondary | ICD-10-CM | POA: Diagnosis not present

## 2020-04-01 DIAGNOSIS — N390 Urinary tract infection, site not specified: Secondary | ICD-10-CM | POA: Diagnosis not present

## 2020-04-02 DIAGNOSIS — N390 Urinary tract infection, site not specified: Secondary | ICD-10-CM | POA: Diagnosis not present

## 2020-04-02 DIAGNOSIS — R319 Hematuria, unspecified: Secondary | ICD-10-CM | POA: Diagnosis not present

## 2020-04-03 DIAGNOSIS — N390 Urinary tract infection, site not specified: Secondary | ICD-10-CM | POA: Diagnosis not present

## 2020-04-03 DIAGNOSIS — R319 Hematuria, unspecified: Secondary | ICD-10-CM | POA: Diagnosis not present

## 2020-04-25 DIAGNOSIS — E785 Hyperlipidemia, unspecified: Secondary | ICD-10-CM | POA: Diagnosis not present

## 2020-04-25 DIAGNOSIS — I2510 Atherosclerotic heart disease of native coronary artery without angina pectoris: Secondary | ICD-10-CM | POA: Diagnosis not present

## 2020-04-25 DIAGNOSIS — I10 Essential (primary) hypertension: Secondary | ICD-10-CM | POA: Diagnosis not present

## 2020-04-25 DIAGNOSIS — M7989 Other specified soft tissue disorders: Secondary | ICD-10-CM | POA: Diagnosis not present

## 2020-04-26 DIAGNOSIS — Z1231 Encounter for screening mammogram for malignant neoplasm of breast: Secondary | ICD-10-CM | POA: Diagnosis not present

## 2020-05-21 DIAGNOSIS — M21371 Foot drop, right foot: Secondary | ICD-10-CM | POA: Diagnosis not present

## 2020-05-21 DIAGNOSIS — M21372 Foot drop, left foot: Secondary | ICD-10-CM | POA: Diagnosis not present

## 2020-05-21 DIAGNOSIS — G600 Hereditary motor and sensory neuropathy: Secondary | ICD-10-CM | POA: Diagnosis not present

## 2020-06-04 DIAGNOSIS — M7989 Other specified soft tissue disorders: Secondary | ICD-10-CM | POA: Diagnosis not present

## 2020-06-04 DIAGNOSIS — Z201 Contact with and (suspected) exposure to tuberculosis: Secondary | ICD-10-CM | POA: Diagnosis not present

## 2020-06-06 DIAGNOSIS — Z23 Encounter for immunization: Secondary | ICD-10-CM | POA: Diagnosis not present

## 2020-06-11 DIAGNOSIS — Z201 Contact with and (suspected) exposure to tuberculosis: Secondary | ICD-10-CM | POA: Diagnosis not present

## 2020-06-24 DIAGNOSIS — L661 Lichen planopilaris: Secondary | ICD-10-CM | POA: Diagnosis not present

## 2020-06-29 DIAGNOSIS — M810 Age-related osteoporosis without current pathological fracture: Secondary | ICD-10-CM | POA: Diagnosis not present

## 2020-06-29 DIAGNOSIS — E785 Hyperlipidemia, unspecified: Secondary | ICD-10-CM | POA: Diagnosis not present

## 2020-06-29 DIAGNOSIS — R7301 Impaired fasting glucose: Secondary | ICD-10-CM | POA: Diagnosis not present

## 2020-06-29 DIAGNOSIS — I10 Essential (primary) hypertension: Secondary | ICD-10-CM | POA: Diagnosis not present

## 2020-06-29 DIAGNOSIS — R8290 Unspecified abnormal findings in urine: Secondary | ICD-10-CM | POA: Diagnosis not present

## 2020-07-05 DIAGNOSIS — N3000 Acute cystitis without hematuria: Secondary | ICD-10-CM | POA: Diagnosis not present

## 2020-07-05 DIAGNOSIS — G8929 Other chronic pain: Secondary | ICD-10-CM | POA: Diagnosis not present

## 2020-07-05 DIAGNOSIS — M545 Low back pain, unspecified: Secondary | ICD-10-CM | POA: Diagnosis not present

## 2020-07-11 DIAGNOSIS — M7989 Other specified soft tissue disorders: Secondary | ICD-10-CM | POA: Diagnosis not present

## 2020-07-11 DIAGNOSIS — G600 Hereditary motor and sensory neuropathy: Secondary | ICD-10-CM | POA: Diagnosis not present

## 2020-07-11 DIAGNOSIS — Z7409 Other reduced mobility: Secondary | ICD-10-CM | POA: Diagnosis not present

## 2020-07-17 DIAGNOSIS — R531 Weakness: Secondary | ICD-10-CM | POA: Diagnosis not present

## 2020-07-17 DIAGNOSIS — R350 Frequency of micturition: Secondary | ICD-10-CM | POA: Diagnosis not present

## 2020-07-22 DIAGNOSIS — I10 Essential (primary) hypertension: Secondary | ICD-10-CM | POA: Diagnosis not present

## 2020-07-22 DIAGNOSIS — E785 Hyperlipidemia, unspecified: Secondary | ICD-10-CM | POA: Diagnosis not present

## 2020-07-22 DIAGNOSIS — R7301 Impaired fasting glucose: Secondary | ICD-10-CM | POA: Diagnosis not present

## 2020-08-08 DIAGNOSIS — I10 Essential (primary) hypertension: Secondary | ICD-10-CM | POA: Diagnosis not present

## 2020-08-08 DIAGNOSIS — M25471 Effusion, right ankle: Secondary | ICD-10-CM | POA: Diagnosis not present

## 2020-08-08 DIAGNOSIS — M25472 Effusion, left ankle: Secondary | ICD-10-CM | POA: Diagnosis not present

## 2020-08-08 DIAGNOSIS — I2510 Atherosclerotic heart disease of native coronary artery without angina pectoris: Secondary | ICD-10-CM | POA: Diagnosis not present

## 2020-08-08 DIAGNOSIS — E785 Hyperlipidemia, unspecified: Secondary | ICD-10-CM | POA: Diagnosis not present

## 2020-08-09 DIAGNOSIS — K5901 Slow transit constipation: Secondary | ICD-10-CM | POA: Diagnosis not present

## 2020-08-09 DIAGNOSIS — M1469 Charcot's joint, multiple sites: Secondary | ICD-10-CM | POA: Diagnosis not present

## 2020-08-09 DIAGNOSIS — R2689 Other abnormalities of gait and mobility: Secondary | ICD-10-CM | POA: Diagnosis not present

## 2020-08-09 DIAGNOSIS — M25551 Pain in right hip: Secondary | ICD-10-CM | POA: Diagnosis not present

## 2020-08-09 DIAGNOSIS — M6281 Muscle weakness (generalized): Secondary | ICD-10-CM | POA: Diagnosis not present

## 2020-08-09 DIAGNOSIS — R350 Frequency of micturition: Secondary | ICD-10-CM | POA: Diagnosis not present

## 2020-08-09 DIAGNOSIS — G600 Hereditary motor and sensory neuropathy: Secondary | ICD-10-CM | POA: Diagnosis not present

## 2020-08-13 DIAGNOSIS — S51812A Laceration without foreign body of left forearm, initial encounter: Secondary | ICD-10-CM | POA: Diagnosis not present

## 2020-08-13 DIAGNOSIS — S50812A Abrasion of left forearm, initial encounter: Secondary | ICD-10-CM | POA: Diagnosis not present

## 2020-08-14 DIAGNOSIS — M25551 Pain in right hip: Secondary | ICD-10-CM | POA: Diagnosis not present

## 2020-08-14 DIAGNOSIS — R2689 Other abnormalities of gait and mobility: Secondary | ICD-10-CM | POA: Diagnosis not present

## 2020-08-14 DIAGNOSIS — G600 Hereditary motor and sensory neuropathy: Secondary | ICD-10-CM | POA: Diagnosis not present

## 2020-08-14 DIAGNOSIS — M1469 Charcot's joint, multiple sites: Secondary | ICD-10-CM | POA: Diagnosis not present

## 2020-08-14 DIAGNOSIS — M6281 Muscle weakness (generalized): Secondary | ICD-10-CM | POA: Diagnosis not present

## 2020-08-16 DIAGNOSIS — X58XXXA Exposure to other specified factors, initial encounter: Secondary | ICD-10-CM | POA: Diagnosis not present

## 2020-08-16 DIAGNOSIS — T161XXA Foreign body in right ear, initial encounter: Secondary | ICD-10-CM | POA: Diagnosis not present

## 2020-08-20 DIAGNOSIS — M25551 Pain in right hip: Secondary | ICD-10-CM | POA: Diagnosis not present

## 2020-08-20 DIAGNOSIS — R2689 Other abnormalities of gait and mobility: Secondary | ICD-10-CM | POA: Diagnosis not present

## 2020-08-20 DIAGNOSIS — G600 Hereditary motor and sensory neuropathy: Secondary | ICD-10-CM | POA: Diagnosis not present

## 2020-08-20 DIAGNOSIS — M1469 Charcot's joint, multiple sites: Secondary | ICD-10-CM | POA: Diagnosis not present

## 2020-08-20 DIAGNOSIS — N390 Urinary tract infection, site not specified: Secondary | ICD-10-CM | POA: Diagnosis not present

## 2020-08-20 DIAGNOSIS — M6281 Muscle weakness (generalized): Secondary | ICD-10-CM | POA: Diagnosis not present

## 2020-08-22 DIAGNOSIS — M6281 Muscle weakness (generalized): Secondary | ICD-10-CM | POA: Diagnosis not present

## 2020-08-22 DIAGNOSIS — M25551 Pain in right hip: Secondary | ICD-10-CM | POA: Diagnosis not present

## 2020-08-22 DIAGNOSIS — M1469 Charcot's joint, multiple sites: Secondary | ICD-10-CM | POA: Diagnosis not present

## 2020-08-22 DIAGNOSIS — R2689 Other abnormalities of gait and mobility: Secondary | ICD-10-CM | POA: Diagnosis not present

## 2020-08-22 DIAGNOSIS — G600 Hereditary motor and sensory neuropathy: Secondary | ICD-10-CM | POA: Diagnosis not present

## 2020-08-23 DIAGNOSIS — M6281 Muscle weakness (generalized): Secondary | ICD-10-CM | POA: Diagnosis not present

## 2020-08-23 DIAGNOSIS — M1469 Charcot's joint, multiple sites: Secondary | ICD-10-CM | POA: Diagnosis not present

## 2020-08-23 DIAGNOSIS — G600 Hereditary motor and sensory neuropathy: Secondary | ICD-10-CM | POA: Diagnosis not present

## 2020-08-23 DIAGNOSIS — M25551 Pain in right hip: Secondary | ICD-10-CM | POA: Diagnosis not present

## 2020-08-23 DIAGNOSIS — R2689 Other abnormalities of gait and mobility: Secondary | ICD-10-CM | POA: Diagnosis not present

## 2020-08-26 DIAGNOSIS — R2689 Other abnormalities of gait and mobility: Secondary | ICD-10-CM | POA: Diagnosis not present

## 2020-08-26 DIAGNOSIS — M1469 Charcot's joint, multiple sites: Secondary | ICD-10-CM | POA: Diagnosis not present

## 2020-08-26 DIAGNOSIS — M25551 Pain in right hip: Secondary | ICD-10-CM | POA: Diagnosis not present

## 2020-08-26 DIAGNOSIS — G600 Hereditary motor and sensory neuropathy: Secondary | ICD-10-CM | POA: Diagnosis not present

## 2020-08-26 DIAGNOSIS — M6281 Muscle weakness (generalized): Secondary | ICD-10-CM | POA: Diagnosis not present

## 2020-08-27 DIAGNOSIS — R2689 Other abnormalities of gait and mobility: Secondary | ICD-10-CM | POA: Diagnosis not present

## 2020-08-27 DIAGNOSIS — G600 Hereditary motor and sensory neuropathy: Secondary | ICD-10-CM | POA: Diagnosis not present

## 2020-08-27 DIAGNOSIS — M6281 Muscle weakness (generalized): Secondary | ICD-10-CM | POA: Diagnosis not present

## 2020-08-27 DIAGNOSIS — M1469 Charcot's joint, multiple sites: Secondary | ICD-10-CM | POA: Diagnosis not present

## 2020-08-27 DIAGNOSIS — M25551 Pain in right hip: Secondary | ICD-10-CM | POA: Diagnosis not present

## 2020-08-30 DIAGNOSIS — M1469 Charcot's joint, multiple sites: Secondary | ICD-10-CM | POA: Diagnosis not present

## 2020-08-30 DIAGNOSIS — M6281 Muscle weakness (generalized): Secondary | ICD-10-CM | POA: Diagnosis not present

## 2020-08-30 DIAGNOSIS — R2689 Other abnormalities of gait and mobility: Secondary | ICD-10-CM | POA: Diagnosis not present

## 2020-08-30 DIAGNOSIS — G600 Hereditary motor and sensory neuropathy: Secondary | ICD-10-CM | POA: Diagnosis not present

## 2020-08-30 DIAGNOSIS — M25551 Pain in right hip: Secondary | ICD-10-CM | POA: Diagnosis not present

## 2020-09-05 DIAGNOSIS — N390 Urinary tract infection, site not specified: Secondary | ICD-10-CM | POA: Diagnosis not present

## 2020-09-09 DIAGNOSIS — H02201 Unspecified lagophthalmos right upper eyelid: Secondary | ICD-10-CM | POA: Diagnosis not present

## 2020-09-09 DIAGNOSIS — H18513 Endothelial corneal dystrophy, bilateral: Secondary | ICD-10-CM | POA: Diagnosis not present

## 2020-09-09 DIAGNOSIS — H43813 Vitreous degeneration, bilateral: Secondary | ICD-10-CM | POA: Diagnosis not present

## 2020-09-09 DIAGNOSIS — Z961 Presence of intraocular lens: Secondary | ICD-10-CM | POA: Diagnosis not present

## 2020-09-09 DIAGNOSIS — H353131 Nonexudative age-related macular degeneration, bilateral, early dry stage: Secondary | ICD-10-CM | POA: Diagnosis not present

## 2020-09-09 DIAGNOSIS — H16223 Keratoconjunctivitis sicca, not specified as Sjogren's, bilateral: Secondary | ICD-10-CM | POA: Diagnosis not present

## 2020-09-09 DIAGNOSIS — H02834 Dermatochalasis of left upper eyelid: Secondary | ICD-10-CM | POA: Diagnosis not present

## 2020-09-09 DIAGNOSIS — H02831 Dermatochalasis of right upper eyelid: Secondary | ICD-10-CM | POA: Diagnosis not present

## 2020-09-09 DIAGNOSIS — H31103 Choroidal degeneration, unspecified, bilateral: Secondary | ICD-10-CM | POA: Diagnosis not present

## 2020-09-09 DIAGNOSIS — H47293 Other optic atrophy, bilateral: Secondary | ICD-10-CM | POA: Diagnosis not present

## 2020-09-09 DIAGNOSIS — H02204 Unspecified lagophthalmos left upper eyelid: Secondary | ICD-10-CM | POA: Diagnosis not present

## 2020-09-10 DIAGNOSIS — M25551 Pain in right hip: Secondary | ICD-10-CM | POA: Diagnosis not present

## 2020-09-10 DIAGNOSIS — M1469 Charcot's joint, multiple sites: Secondary | ICD-10-CM | POA: Diagnosis not present

## 2020-09-10 DIAGNOSIS — R2689 Other abnormalities of gait and mobility: Secondary | ICD-10-CM | POA: Diagnosis not present

## 2020-09-10 DIAGNOSIS — G600 Hereditary motor and sensory neuropathy: Secondary | ICD-10-CM | POA: Diagnosis not present

## 2020-09-10 DIAGNOSIS — M6281 Muscle weakness (generalized): Secondary | ICD-10-CM | POA: Diagnosis not present

## 2020-09-11 DIAGNOSIS — R2689 Other abnormalities of gait and mobility: Secondary | ICD-10-CM | POA: Diagnosis not present

## 2020-09-11 DIAGNOSIS — G600 Hereditary motor and sensory neuropathy: Secondary | ICD-10-CM | POA: Diagnosis not present

## 2020-09-11 DIAGNOSIS — M25551 Pain in right hip: Secondary | ICD-10-CM | POA: Diagnosis not present

## 2020-09-11 DIAGNOSIS — M6281 Muscle weakness (generalized): Secondary | ICD-10-CM | POA: Diagnosis not present

## 2020-09-11 DIAGNOSIS — M1469 Charcot's joint, multiple sites: Secondary | ICD-10-CM | POA: Diagnosis not present

## 2020-09-13 DIAGNOSIS — N3941 Urge incontinence: Secondary | ICD-10-CM | POA: Diagnosis not present

## 2020-09-13 DIAGNOSIS — R310 Gross hematuria: Secondary | ICD-10-CM | POA: Diagnosis not present

## 2020-09-13 DIAGNOSIS — N390 Urinary tract infection, site not specified: Secondary | ICD-10-CM | POA: Diagnosis not present

## 2020-09-13 DIAGNOSIS — N343 Urethral syndrome, unspecified: Secondary | ICD-10-CM | POA: Diagnosis not present

## 2020-09-13 DIAGNOSIS — N3281 Overactive bladder: Secondary | ICD-10-CM | POA: Diagnosis not present

## 2020-09-13 DIAGNOSIS — R350 Frequency of micturition: Secondary | ICD-10-CM | POA: Diagnosis not present

## 2020-09-16 DIAGNOSIS — M1469 Charcot's joint, multiple sites: Secondary | ICD-10-CM | POA: Diagnosis not present

## 2020-09-16 DIAGNOSIS — R2689 Other abnormalities of gait and mobility: Secondary | ICD-10-CM | POA: Diagnosis not present

## 2020-09-16 DIAGNOSIS — G600 Hereditary motor and sensory neuropathy: Secondary | ICD-10-CM | POA: Diagnosis not present

## 2020-09-16 DIAGNOSIS — M25551 Pain in right hip: Secondary | ICD-10-CM | POA: Diagnosis not present

## 2020-09-16 DIAGNOSIS — M6281 Muscle weakness (generalized): Secondary | ICD-10-CM | POA: Diagnosis not present

## 2020-09-18 DIAGNOSIS — R2689 Other abnormalities of gait and mobility: Secondary | ICD-10-CM | POA: Diagnosis not present

## 2020-09-18 DIAGNOSIS — M1469 Charcot's joint, multiple sites: Secondary | ICD-10-CM | POA: Diagnosis not present

## 2020-09-18 DIAGNOSIS — G600 Hereditary motor and sensory neuropathy: Secondary | ICD-10-CM | POA: Diagnosis not present

## 2020-09-18 DIAGNOSIS — M25551 Pain in right hip: Secondary | ICD-10-CM | POA: Diagnosis not present

## 2020-09-18 DIAGNOSIS — L439 Lichen planus, unspecified: Secondary | ICD-10-CM | POA: Diagnosis not present

## 2020-09-18 DIAGNOSIS — M6281 Muscle weakness (generalized): Secondary | ICD-10-CM | POA: Diagnosis not present

## 2020-09-19 DIAGNOSIS — G600 Hereditary motor and sensory neuropathy: Secondary | ICD-10-CM | POA: Diagnosis not present

## 2020-09-19 DIAGNOSIS — M6281 Muscle weakness (generalized): Secondary | ICD-10-CM | POA: Diagnosis not present

## 2020-09-19 DIAGNOSIS — M1469 Charcot's joint, multiple sites: Secondary | ICD-10-CM | POA: Diagnosis not present

## 2020-09-19 DIAGNOSIS — R2689 Other abnormalities of gait and mobility: Secondary | ICD-10-CM | POA: Diagnosis not present

## 2020-09-19 DIAGNOSIS — M25551 Pain in right hip: Secondary | ICD-10-CM | POA: Diagnosis not present

## 2020-09-23 DIAGNOSIS — M1469 Charcot's joint, multiple sites: Secondary | ICD-10-CM | POA: Diagnosis not present

## 2020-09-23 DIAGNOSIS — M25551 Pain in right hip: Secondary | ICD-10-CM | POA: Diagnosis not present

## 2020-09-23 DIAGNOSIS — M6281 Muscle weakness (generalized): Secondary | ICD-10-CM | POA: Diagnosis not present

## 2020-09-23 DIAGNOSIS — R2689 Other abnormalities of gait and mobility: Secondary | ICD-10-CM | POA: Diagnosis not present

## 2020-09-23 DIAGNOSIS — G600 Hereditary motor and sensory neuropathy: Secondary | ICD-10-CM | POA: Diagnosis not present

## 2020-09-25 DIAGNOSIS — M25551 Pain in right hip: Secondary | ICD-10-CM | POA: Diagnosis not present

## 2020-09-25 DIAGNOSIS — R2689 Other abnormalities of gait and mobility: Secondary | ICD-10-CM | POA: Diagnosis not present

## 2020-09-25 DIAGNOSIS — M1469 Charcot's joint, multiple sites: Secondary | ICD-10-CM | POA: Diagnosis not present

## 2020-09-25 DIAGNOSIS — G600 Hereditary motor and sensory neuropathy: Secondary | ICD-10-CM | POA: Diagnosis not present

## 2020-09-25 DIAGNOSIS — M6281 Muscle weakness (generalized): Secondary | ICD-10-CM | POA: Diagnosis not present

## 2020-09-27 DIAGNOSIS — H53453 Other localized visual field defect, bilateral: Secondary | ICD-10-CM | POA: Diagnosis not present

## 2020-09-27 DIAGNOSIS — H4720 Unspecified optic atrophy: Secondary | ICD-10-CM | POA: Diagnosis not present

## 2020-09-27 DIAGNOSIS — G600 Hereditary motor and sensory neuropathy: Secondary | ICD-10-CM | POA: Diagnosis not present

## 2020-09-27 DIAGNOSIS — H35363 Drusen (degenerative) of macula, bilateral: Secondary | ICD-10-CM | POA: Diagnosis not present

## 2020-09-27 DIAGNOSIS — H18513 Endothelial corneal dystrophy, bilateral: Secondary | ICD-10-CM | POA: Diagnosis not present

## 2020-09-27 DIAGNOSIS — Z961 Presence of intraocular lens: Secondary | ICD-10-CM | POA: Diagnosis not present

## 2020-09-30 DIAGNOSIS — R2689 Other abnormalities of gait and mobility: Secondary | ICD-10-CM | POA: Diagnosis not present

## 2020-09-30 DIAGNOSIS — M25551 Pain in right hip: Secondary | ICD-10-CM | POA: Diagnosis not present

## 2020-09-30 DIAGNOSIS — M6281 Muscle weakness (generalized): Secondary | ICD-10-CM | POA: Diagnosis not present

## 2020-09-30 DIAGNOSIS — M1469 Charcot's joint, multiple sites: Secondary | ICD-10-CM | POA: Diagnosis not present

## 2020-09-30 DIAGNOSIS — G600 Hereditary motor and sensory neuropathy: Secondary | ICD-10-CM | POA: Diagnosis not present

## 2020-10-02 DIAGNOSIS — G600 Hereditary motor and sensory neuropathy: Secondary | ICD-10-CM | POA: Diagnosis not present

## 2020-10-02 DIAGNOSIS — M25551 Pain in right hip: Secondary | ICD-10-CM | POA: Diagnosis not present

## 2020-10-02 DIAGNOSIS — M6281 Muscle weakness (generalized): Secondary | ICD-10-CM | POA: Diagnosis not present

## 2020-10-02 DIAGNOSIS — R2689 Other abnormalities of gait and mobility: Secondary | ICD-10-CM | POA: Diagnosis not present

## 2020-10-02 DIAGNOSIS — M1469 Charcot's joint, multiple sites: Secondary | ICD-10-CM | POA: Diagnosis not present

## 2020-10-03 DIAGNOSIS — M6281 Muscle weakness (generalized): Secondary | ICD-10-CM | POA: Diagnosis not present

## 2020-10-03 DIAGNOSIS — M1469 Charcot's joint, multiple sites: Secondary | ICD-10-CM | POA: Diagnosis not present

## 2020-10-03 DIAGNOSIS — G600 Hereditary motor and sensory neuropathy: Secondary | ICD-10-CM | POA: Diagnosis not present

## 2020-10-03 DIAGNOSIS — M25551 Pain in right hip: Secondary | ICD-10-CM | POA: Diagnosis not present

## 2020-10-03 DIAGNOSIS — R2689 Other abnormalities of gait and mobility: Secondary | ICD-10-CM | POA: Diagnosis not present

## 2020-10-04 DIAGNOSIS — H4720 Unspecified optic atrophy: Secondary | ICD-10-CM | POA: Diagnosis not present

## 2020-10-04 DIAGNOSIS — H47213 Primary optic atrophy, bilateral: Secondary | ICD-10-CM | POA: Diagnosis not present

## 2020-10-04 DIAGNOSIS — I6782 Cerebral ischemia: Secondary | ICD-10-CM | POA: Diagnosis not present

## 2020-10-08 DIAGNOSIS — M6281 Muscle weakness (generalized): Secondary | ICD-10-CM | POA: Diagnosis not present

## 2020-10-08 DIAGNOSIS — M1469 Charcot's joint, multiple sites: Secondary | ICD-10-CM | POA: Diagnosis not present

## 2020-10-08 DIAGNOSIS — M25551 Pain in right hip: Secondary | ICD-10-CM | POA: Diagnosis not present

## 2020-10-08 DIAGNOSIS — R2689 Other abnormalities of gait and mobility: Secondary | ICD-10-CM | POA: Diagnosis not present

## 2020-10-08 DIAGNOSIS — G600 Hereditary motor and sensory neuropathy: Secondary | ICD-10-CM | POA: Diagnosis not present

## 2020-10-16 DIAGNOSIS — M6281 Muscle weakness (generalized): Secondary | ICD-10-CM | POA: Diagnosis not present

## 2020-10-16 DIAGNOSIS — R2689 Other abnormalities of gait and mobility: Secondary | ICD-10-CM | POA: Diagnosis not present

## 2020-10-16 DIAGNOSIS — M1469 Charcot's joint, multiple sites: Secondary | ICD-10-CM | POA: Diagnosis not present

## 2020-10-16 DIAGNOSIS — G600 Hereditary motor and sensory neuropathy: Secondary | ICD-10-CM | POA: Diagnosis not present

## 2020-10-16 DIAGNOSIS — M25551 Pain in right hip: Secondary | ICD-10-CM | POA: Diagnosis not present

## 2020-10-17 DIAGNOSIS — G600 Hereditary motor and sensory neuropathy: Secondary | ICD-10-CM | POA: Diagnosis not present

## 2020-10-17 DIAGNOSIS — R2689 Other abnormalities of gait and mobility: Secondary | ICD-10-CM | POA: Diagnosis not present

## 2020-10-17 DIAGNOSIS — M6281 Muscle weakness (generalized): Secondary | ICD-10-CM | POA: Diagnosis not present

## 2020-10-17 DIAGNOSIS — M25551 Pain in right hip: Secondary | ICD-10-CM | POA: Diagnosis not present

## 2020-10-17 DIAGNOSIS — M1469 Charcot's joint, multiple sites: Secondary | ICD-10-CM | POA: Diagnosis not present

## 2020-10-24 DIAGNOSIS — M1469 Charcot's joint, multiple sites: Secondary | ICD-10-CM | POA: Diagnosis not present

## 2020-10-24 DIAGNOSIS — R2689 Other abnormalities of gait and mobility: Secondary | ICD-10-CM | POA: Diagnosis not present

## 2020-10-24 DIAGNOSIS — G600 Hereditary motor and sensory neuropathy: Secondary | ICD-10-CM | POA: Diagnosis not present

## 2020-10-24 DIAGNOSIS — M6281 Muscle weakness (generalized): Secondary | ICD-10-CM | POA: Diagnosis not present

## 2020-10-24 DIAGNOSIS — M25551 Pain in right hip: Secondary | ICD-10-CM | POA: Diagnosis not present

## 2020-10-25 DIAGNOSIS — M25551 Pain in right hip: Secondary | ICD-10-CM | POA: Diagnosis not present

## 2020-10-25 DIAGNOSIS — R2689 Other abnormalities of gait and mobility: Secondary | ICD-10-CM | POA: Diagnosis not present

## 2020-10-25 DIAGNOSIS — G600 Hereditary motor and sensory neuropathy: Secondary | ICD-10-CM | POA: Diagnosis not present

## 2020-10-25 DIAGNOSIS — M1469 Charcot's joint, multiple sites: Secondary | ICD-10-CM | POA: Diagnosis not present

## 2020-10-25 DIAGNOSIS — M6281 Muscle weakness (generalized): Secondary | ICD-10-CM | POA: Diagnosis not present

## 2020-10-28 DIAGNOSIS — N390 Urinary tract infection, site not specified: Secondary | ICD-10-CM | POA: Diagnosis not present

## 2020-10-29 DIAGNOSIS — M25551 Pain in right hip: Secondary | ICD-10-CM | POA: Diagnosis not present

## 2020-10-29 DIAGNOSIS — M6281 Muscle weakness (generalized): Secondary | ICD-10-CM | POA: Diagnosis not present

## 2020-10-29 DIAGNOSIS — M1469 Charcot's joint, multiple sites: Secondary | ICD-10-CM | POA: Diagnosis not present

## 2020-10-29 DIAGNOSIS — R2689 Other abnormalities of gait and mobility: Secondary | ICD-10-CM | POA: Diagnosis not present

## 2020-10-29 DIAGNOSIS — G600 Hereditary motor and sensory neuropathy: Secondary | ICD-10-CM | POA: Diagnosis not present

## 2020-10-30 DIAGNOSIS — L439 Lichen planus, unspecified: Secondary | ICD-10-CM | POA: Diagnosis not present

## 2020-10-31 DIAGNOSIS — M25551 Pain in right hip: Secondary | ICD-10-CM | POA: Diagnosis not present

## 2020-10-31 DIAGNOSIS — M6281 Muscle weakness (generalized): Secondary | ICD-10-CM | POA: Diagnosis not present

## 2020-10-31 DIAGNOSIS — M1469 Charcot's joint, multiple sites: Secondary | ICD-10-CM | POA: Diagnosis not present

## 2020-10-31 DIAGNOSIS — G600 Hereditary motor and sensory neuropathy: Secondary | ICD-10-CM | POA: Diagnosis not present

## 2020-10-31 DIAGNOSIS — R2689 Other abnormalities of gait and mobility: Secondary | ICD-10-CM | POA: Diagnosis not present

## 2020-11-05 DIAGNOSIS — R2689 Other abnormalities of gait and mobility: Secondary | ICD-10-CM | POA: Diagnosis not present

## 2020-11-05 DIAGNOSIS — G600 Hereditary motor and sensory neuropathy: Secondary | ICD-10-CM | POA: Diagnosis not present

## 2020-11-05 DIAGNOSIS — M6281 Muscle weakness (generalized): Secondary | ICD-10-CM | POA: Diagnosis not present

## 2020-11-05 DIAGNOSIS — M25551 Pain in right hip: Secondary | ICD-10-CM | POA: Diagnosis not present

## 2020-11-05 DIAGNOSIS — M1469 Charcot's joint, multiple sites: Secondary | ICD-10-CM | POA: Diagnosis not present

## 2020-11-07 DIAGNOSIS — M1469 Charcot's joint, multiple sites: Secondary | ICD-10-CM | POA: Diagnosis not present

## 2020-11-07 DIAGNOSIS — G600 Hereditary motor and sensory neuropathy: Secondary | ICD-10-CM | POA: Diagnosis not present

## 2020-11-07 DIAGNOSIS — M25551 Pain in right hip: Secondary | ICD-10-CM | POA: Diagnosis not present

## 2020-11-07 DIAGNOSIS — R2689 Other abnormalities of gait and mobility: Secondary | ICD-10-CM | POA: Diagnosis not present

## 2020-11-07 DIAGNOSIS — M6281 Muscle weakness (generalized): Secondary | ICD-10-CM | POA: Diagnosis not present

## 2020-11-08 DIAGNOSIS — R300 Dysuria: Secondary | ICD-10-CM | POA: Diagnosis not present

## 2020-11-08 DIAGNOSIS — N390 Urinary tract infection, site not specified: Secondary | ICD-10-CM | POA: Diagnosis not present

## 2020-11-12 DIAGNOSIS — R2689 Other abnormalities of gait and mobility: Secondary | ICD-10-CM | POA: Diagnosis not present

## 2020-11-12 DIAGNOSIS — M6281 Muscle weakness (generalized): Secondary | ICD-10-CM | POA: Diagnosis not present

## 2020-11-12 DIAGNOSIS — G600 Hereditary motor and sensory neuropathy: Secondary | ICD-10-CM | POA: Diagnosis not present

## 2020-11-12 DIAGNOSIS — M25551 Pain in right hip: Secondary | ICD-10-CM | POA: Diagnosis not present

## 2020-11-12 DIAGNOSIS — M1469 Charcot's joint, multiple sites: Secondary | ICD-10-CM | POA: Diagnosis not present

## 2020-11-14 DIAGNOSIS — M1469 Charcot's joint, multiple sites: Secondary | ICD-10-CM | POA: Diagnosis not present

## 2020-11-14 DIAGNOSIS — M6281 Muscle weakness (generalized): Secondary | ICD-10-CM | POA: Diagnosis not present

## 2020-11-14 DIAGNOSIS — G600 Hereditary motor and sensory neuropathy: Secondary | ICD-10-CM | POA: Diagnosis not present

## 2020-11-14 DIAGNOSIS — R2689 Other abnormalities of gait and mobility: Secondary | ICD-10-CM | POA: Diagnosis not present

## 2020-11-14 DIAGNOSIS — M25551 Pain in right hip: Secondary | ICD-10-CM | POA: Diagnosis not present

## 2020-11-19 DIAGNOSIS — M1469 Charcot's joint, multiple sites: Secondary | ICD-10-CM | POA: Diagnosis not present

## 2020-11-19 DIAGNOSIS — M6281 Muscle weakness (generalized): Secondary | ICD-10-CM | POA: Diagnosis not present

## 2020-11-19 DIAGNOSIS — G600 Hereditary motor and sensory neuropathy: Secondary | ICD-10-CM | POA: Diagnosis not present

## 2020-11-19 DIAGNOSIS — R2689 Other abnormalities of gait and mobility: Secondary | ICD-10-CM | POA: Diagnosis not present

## 2020-11-19 DIAGNOSIS — M25551 Pain in right hip: Secondary | ICD-10-CM | POA: Diagnosis not present

## 2020-11-20 DIAGNOSIS — M25551 Pain in right hip: Secondary | ICD-10-CM | POA: Diagnosis not present

## 2020-11-20 DIAGNOSIS — M1469 Charcot's joint, multiple sites: Secondary | ICD-10-CM | POA: Diagnosis not present

## 2020-11-20 DIAGNOSIS — M6281 Muscle weakness (generalized): Secondary | ICD-10-CM | POA: Diagnosis not present

## 2020-11-20 DIAGNOSIS — R2689 Other abnormalities of gait and mobility: Secondary | ICD-10-CM | POA: Diagnosis not present

## 2020-11-20 DIAGNOSIS — G600 Hereditary motor and sensory neuropathy: Secondary | ICD-10-CM | POA: Diagnosis not present

## 2020-11-25 DIAGNOSIS — E785 Hyperlipidemia, unspecified: Secondary | ICD-10-CM | POA: Diagnosis not present

## 2020-11-25 DIAGNOSIS — M25512 Pain in left shoulder: Secondary | ICD-10-CM | POA: Diagnosis not present

## 2020-11-25 DIAGNOSIS — R7301 Impaired fasting glucose: Secondary | ICD-10-CM | POA: Diagnosis not present

## 2020-11-25 DIAGNOSIS — I10 Essential (primary) hypertension: Secondary | ICD-10-CM | POA: Diagnosis not present

## 2020-11-25 DIAGNOSIS — Z23 Encounter for immunization: Secondary | ICD-10-CM | POA: Diagnosis not present

## 2020-11-26 DIAGNOSIS — M25551 Pain in right hip: Secondary | ICD-10-CM | POA: Diagnosis not present

## 2020-11-26 DIAGNOSIS — M6281 Muscle weakness (generalized): Secondary | ICD-10-CM | POA: Diagnosis not present

## 2020-11-26 DIAGNOSIS — R2689 Other abnormalities of gait and mobility: Secondary | ICD-10-CM | POA: Diagnosis not present

## 2020-11-26 DIAGNOSIS — G600 Hereditary motor and sensory neuropathy: Secondary | ICD-10-CM | POA: Diagnosis not present

## 2020-11-26 DIAGNOSIS — M1469 Charcot's joint, multiple sites: Secondary | ICD-10-CM | POA: Diagnosis not present

## 2020-11-27 DIAGNOSIS — R2689 Other abnormalities of gait and mobility: Secondary | ICD-10-CM | POA: Diagnosis not present

## 2020-11-27 DIAGNOSIS — M1469 Charcot's joint, multiple sites: Secondary | ICD-10-CM | POA: Diagnosis not present

## 2020-11-27 DIAGNOSIS — M6281 Muscle weakness (generalized): Secondary | ICD-10-CM | POA: Diagnosis not present

## 2020-11-27 DIAGNOSIS — M25551 Pain in right hip: Secondary | ICD-10-CM | POA: Diagnosis not present

## 2020-11-27 DIAGNOSIS — G600 Hereditary motor and sensory neuropathy: Secondary | ICD-10-CM | POA: Diagnosis not present

## 2020-12-03 DIAGNOSIS — M25551 Pain in right hip: Secondary | ICD-10-CM | POA: Diagnosis not present

## 2020-12-03 DIAGNOSIS — R2689 Other abnormalities of gait and mobility: Secondary | ICD-10-CM | POA: Diagnosis not present

## 2020-12-03 DIAGNOSIS — M6281 Muscle weakness (generalized): Secondary | ICD-10-CM | POA: Diagnosis not present

## 2020-12-03 DIAGNOSIS — M1469 Charcot's joint, multiple sites: Secondary | ICD-10-CM | POA: Diagnosis not present

## 2020-12-03 DIAGNOSIS — G600 Hereditary motor and sensory neuropathy: Secondary | ICD-10-CM | POA: Diagnosis not present

## 2020-12-04 DIAGNOSIS — R2689 Other abnormalities of gait and mobility: Secondary | ICD-10-CM | POA: Diagnosis not present

## 2020-12-04 DIAGNOSIS — M25551 Pain in right hip: Secondary | ICD-10-CM | POA: Diagnosis not present

## 2020-12-04 DIAGNOSIS — M6281 Muscle weakness (generalized): Secondary | ICD-10-CM | POA: Diagnosis not present

## 2020-12-04 DIAGNOSIS — M1469 Charcot's joint, multiple sites: Secondary | ICD-10-CM | POA: Diagnosis not present

## 2020-12-04 DIAGNOSIS — G600 Hereditary motor and sensory neuropathy: Secondary | ICD-10-CM | POA: Diagnosis not present

## 2020-12-09 DIAGNOSIS — G600 Hereditary motor and sensory neuropathy: Secondary | ICD-10-CM | POA: Diagnosis not present

## 2020-12-09 DIAGNOSIS — R2689 Other abnormalities of gait and mobility: Secondary | ICD-10-CM | POA: Diagnosis not present

## 2020-12-09 DIAGNOSIS — M6281 Muscle weakness (generalized): Secondary | ICD-10-CM | POA: Diagnosis not present

## 2020-12-09 DIAGNOSIS — M25551 Pain in right hip: Secondary | ICD-10-CM | POA: Diagnosis not present

## 2020-12-09 DIAGNOSIS — M1469 Charcot's joint, multiple sites: Secondary | ICD-10-CM | POA: Diagnosis not present

## 2020-12-10 DIAGNOSIS — I10 Essential (primary) hypertension: Secondary | ICD-10-CM | POA: Diagnosis not present

## 2020-12-10 DIAGNOSIS — E785 Hyperlipidemia, unspecified: Secondary | ICD-10-CM | POA: Diagnosis not present

## 2020-12-10 DIAGNOSIS — I2510 Atherosclerotic heart disease of native coronary artery without angina pectoris: Secondary | ICD-10-CM | POA: Diagnosis not present

## 2020-12-11 DIAGNOSIS — M1469 Charcot's joint, multiple sites: Secondary | ICD-10-CM | POA: Diagnosis not present

## 2020-12-11 DIAGNOSIS — M25551 Pain in right hip: Secondary | ICD-10-CM | POA: Diagnosis not present

## 2020-12-11 DIAGNOSIS — R2689 Other abnormalities of gait and mobility: Secondary | ICD-10-CM | POA: Diagnosis not present

## 2020-12-11 DIAGNOSIS — G600 Hereditary motor and sensory neuropathy: Secondary | ICD-10-CM | POA: Diagnosis not present

## 2020-12-11 DIAGNOSIS — M6281 Muscle weakness (generalized): Secondary | ICD-10-CM | POA: Diagnosis not present

## 2020-12-16 DIAGNOSIS — N390 Urinary tract infection, site not specified: Secondary | ICD-10-CM | POA: Diagnosis not present

## 2020-12-16 DIAGNOSIS — N3281 Overactive bladder: Secondary | ICD-10-CM | POA: Diagnosis not present

## 2020-12-16 DIAGNOSIS — B965 Pseudomonas (aeruginosa) (mallei) (pseudomallei) as the cause of diseases classified elsewhere: Secondary | ICD-10-CM | POA: Diagnosis not present

## 2020-12-17 DIAGNOSIS — R2689 Other abnormalities of gait and mobility: Secondary | ICD-10-CM | POA: Diagnosis not present

## 2020-12-17 DIAGNOSIS — M1469 Charcot's joint, multiple sites: Secondary | ICD-10-CM | POA: Diagnosis not present

## 2020-12-17 DIAGNOSIS — M25551 Pain in right hip: Secondary | ICD-10-CM | POA: Diagnosis not present

## 2020-12-17 DIAGNOSIS — M6281 Muscle weakness (generalized): Secondary | ICD-10-CM | POA: Diagnosis not present

## 2020-12-17 DIAGNOSIS — G600 Hereditary motor and sensory neuropathy: Secondary | ICD-10-CM | POA: Diagnosis not present

## 2020-12-18 DIAGNOSIS — R2689 Other abnormalities of gait and mobility: Secondary | ICD-10-CM | POA: Diagnosis not present

## 2020-12-18 DIAGNOSIS — M6281 Muscle weakness (generalized): Secondary | ICD-10-CM | POA: Diagnosis not present

## 2020-12-18 DIAGNOSIS — M1469 Charcot's joint, multiple sites: Secondary | ICD-10-CM | POA: Diagnosis not present

## 2020-12-18 DIAGNOSIS — G600 Hereditary motor and sensory neuropathy: Secondary | ICD-10-CM | POA: Diagnosis not present

## 2020-12-18 DIAGNOSIS — M25551 Pain in right hip: Secondary | ICD-10-CM | POA: Diagnosis not present

## 2020-12-23 DIAGNOSIS — L661 Lichen planopilaris: Secondary | ICD-10-CM | POA: Diagnosis not present

## 2020-12-24 DIAGNOSIS — G600 Hereditary motor and sensory neuropathy: Secondary | ICD-10-CM | POA: Diagnosis not present

## 2020-12-24 DIAGNOSIS — M6281 Muscle weakness (generalized): Secondary | ICD-10-CM | POA: Diagnosis not present

## 2020-12-24 DIAGNOSIS — R2689 Other abnormalities of gait and mobility: Secondary | ICD-10-CM | POA: Diagnosis not present

## 2020-12-24 DIAGNOSIS — M25551 Pain in right hip: Secondary | ICD-10-CM | POA: Diagnosis not present

## 2020-12-24 DIAGNOSIS — M1469 Charcot's joint, multiple sites: Secondary | ICD-10-CM | POA: Diagnosis not present

## 2020-12-25 DIAGNOSIS — R2689 Other abnormalities of gait and mobility: Secondary | ICD-10-CM | POA: Diagnosis not present

## 2020-12-25 DIAGNOSIS — M25551 Pain in right hip: Secondary | ICD-10-CM | POA: Diagnosis not present

## 2020-12-25 DIAGNOSIS — M1469 Charcot's joint, multiple sites: Secondary | ICD-10-CM | POA: Diagnosis not present

## 2020-12-25 DIAGNOSIS — G600 Hereditary motor and sensory neuropathy: Secondary | ICD-10-CM | POA: Diagnosis not present

## 2020-12-25 DIAGNOSIS — M6281 Muscle weakness (generalized): Secondary | ICD-10-CM | POA: Diagnosis not present

## 2020-12-30 DIAGNOSIS — M25551 Pain in right hip: Secondary | ICD-10-CM | POA: Diagnosis not present

## 2020-12-30 DIAGNOSIS — R2689 Other abnormalities of gait and mobility: Secondary | ICD-10-CM | POA: Diagnosis not present

## 2020-12-30 DIAGNOSIS — M1469 Charcot's joint, multiple sites: Secondary | ICD-10-CM | POA: Diagnosis not present

## 2020-12-30 DIAGNOSIS — M6281 Muscle weakness (generalized): Secondary | ICD-10-CM | POA: Diagnosis not present

## 2020-12-30 DIAGNOSIS — G600 Hereditary motor and sensory neuropathy: Secondary | ICD-10-CM | POA: Diagnosis not present

## 2020-12-31 DIAGNOSIS — M6281 Muscle weakness (generalized): Secondary | ICD-10-CM | POA: Diagnosis not present

## 2020-12-31 DIAGNOSIS — G600 Hereditary motor and sensory neuropathy: Secondary | ICD-10-CM | POA: Diagnosis not present

## 2020-12-31 DIAGNOSIS — M1469 Charcot's joint, multiple sites: Secondary | ICD-10-CM | POA: Diagnosis not present

## 2020-12-31 DIAGNOSIS — R2689 Other abnormalities of gait and mobility: Secondary | ICD-10-CM | POA: Diagnosis not present

## 2020-12-31 DIAGNOSIS — M25551 Pain in right hip: Secondary | ICD-10-CM | POA: Diagnosis not present

## 2021-01-07 DIAGNOSIS — B9689 Other specified bacterial agents as the cause of diseases classified elsewhere: Secondary | ICD-10-CM | POA: Diagnosis not present

## 2021-01-07 DIAGNOSIS — N390 Urinary tract infection, site not specified: Secondary | ICD-10-CM | POA: Diagnosis not present

## 2021-01-13 DIAGNOSIS — H0102B Squamous blepharitis left eye, upper and lower eyelids: Secondary | ICD-10-CM | POA: Diagnosis not present

## 2021-01-13 DIAGNOSIS — H0102A Squamous blepharitis right eye, upper and lower eyelids: Secondary | ICD-10-CM | POA: Diagnosis not present

## 2021-01-13 DIAGNOSIS — H16223 Keratoconjunctivitis sicca, not specified as Sjogren's, bilateral: Secondary | ICD-10-CM | POA: Diagnosis not present

## 2021-01-13 DIAGNOSIS — H18513 Endothelial corneal dystrophy, bilateral: Secondary | ICD-10-CM | POA: Diagnosis not present

## 2021-01-13 DIAGNOSIS — H47293 Other optic atrophy, bilateral: Secondary | ICD-10-CM | POA: Diagnosis not present

## 2021-01-13 DIAGNOSIS — H353131 Nonexudative age-related macular degeneration, bilateral, early dry stage: Secondary | ICD-10-CM | POA: Diagnosis not present

## 2021-01-17 DIAGNOSIS — N390 Urinary tract infection, site not specified: Secondary | ICD-10-CM | POA: Diagnosis not present

## 2021-01-17 DIAGNOSIS — K5900 Constipation, unspecified: Secondary | ICD-10-CM | POA: Diagnosis not present

## 2021-01-17 DIAGNOSIS — N343 Urethral syndrome, unspecified: Secondary | ICD-10-CM | POA: Diagnosis not present

## 2022-05-12 ENCOUNTER — Emergency Department (HOSPITAL_BASED_OUTPATIENT_CLINIC_OR_DEPARTMENT_OTHER): Payer: PPO

## 2022-05-12 ENCOUNTER — Encounter (HOSPITAL_BASED_OUTPATIENT_CLINIC_OR_DEPARTMENT_OTHER): Payer: Self-pay

## 2022-05-12 ENCOUNTER — Emergency Department (HOSPITAL_BASED_OUTPATIENT_CLINIC_OR_DEPARTMENT_OTHER)
Admission: EM | Admit: 2022-05-12 | Discharge: 2022-05-13 | Discharge status: Against Medical Advice | Payer: PPO | Attending: Emergency Medicine | Admitting: Emergency Medicine

## 2022-05-12 DIAGNOSIS — R519 Headache, unspecified: Secondary | ICD-10-CM | POA: Insufficient documentation

## 2022-05-12 DIAGNOSIS — W01198A Fall on same level from slipping, tripping and stumbling with subsequent striking against other object, initial encounter: Secondary | ICD-10-CM | POA: Diagnosis not present

## 2022-05-12 DIAGNOSIS — M25512 Pain in left shoulder: Secondary | ICD-10-CM | POA: Insufficient documentation

## 2022-05-12 DIAGNOSIS — M546 Pain in thoracic spine: Secondary | ICD-10-CM | POA: Insufficient documentation

## 2022-05-12 DIAGNOSIS — M542 Cervicalgia: Secondary | ICD-10-CM | POA: Diagnosis not present

## 2022-05-12 DIAGNOSIS — Z5321 Procedure and treatment not carried out due to patient leaving prior to being seen by health care provider: Secondary | ICD-10-CM | POA: Insufficient documentation

## 2022-05-12 MED ORDER — ACETAMINOPHEN 500 MG PO TABS
1000.0000 mg | ORAL_TABLET | Freq: Once | ORAL | Status: AC
  Administered 2022-05-12: 1000 mg via ORAL
  Filled 2022-05-12: qty 2

## 2022-05-12 NOTE — ED Provider Triage Note (Signed)
Emergency Medicine Provider Triage Evaluation Note  Nina Rush , a 82 y.o. female  was evaluated in triage.  Pt complains of fall.  She was bending over to get something out of her refrigerator.  She reports that she got dizzy and lost her balance.  She reports she fell backwards and hit the back of her head.  No loss of consciousness.  She reports she does have some general headache and especially pain in the left shoulder.  She also endorses some pain in the neck.  No new weakness or numbness in the extremities.  She has chronic weakness in the lower extremities and wears braces on both lower legs at baseline.  No anticoagulants.  Review of Systems  Positive: Left shoulder pain Negative: Abdominal pain  Physical Exam  There were no vitals taken for this visit. Gen:   Awake, no distress GCS 15 mental status clear. Resp:  Normal effort  MSK:   Pain at the left shoulder.  No obvious deformity.  Wearing chronic ankle braces on both lower extremities Other:  Endorses some midline cervical tenderness C5-T1.  Strength intact.  Medical Decision Making  Medically screening exam initiated at 9:01 PM.  Appropriate orders placed.  Marialis Kuder was informed that the remainder of the evaluation will be completed by another provider, this initial triage assessment does not replace that evaluation, and the importance of remaining in the ED until their evaluation is complete.  Diagnostic imaging ordered.   Charlesetta Shanks, MD 05/12/22 2103

## 2022-05-12 NOTE — ED Notes (Addendum)
Pt to imaging prior to VS collection

## 2022-05-12 NOTE — ED Triage Notes (Signed)
Pt via [facility]; mechanical fall, not on thinners, landed on back. Pt c/o lower cervical/ thoracic pain. Cspine cleared at bridge. Pt A&O4, NAD at time of triage
